# Patient Record
Sex: Male | Born: 1945
Health system: Southern US, Community
[De-identification: ages and names within clinical notes are randomized; demographics above are authoritative.]

## PROBLEM LIST (undated history)

## (undated) DIAGNOSIS — N400 Enlarged prostate without lower urinary tract symptoms: Secondary | ICD-10-CM

## (undated) DIAGNOSIS — E785 Hyperlipidemia, unspecified: Secondary | ICD-10-CM

## (undated) DIAGNOSIS — K635 Polyp of colon: Secondary | ICD-10-CM

## (undated) DIAGNOSIS — K810 Acute cholecystitis: Secondary | ICD-10-CM

## (undated) DIAGNOSIS — I10 Essential (primary) hypertension: Secondary | ICD-10-CM

## (undated) DIAGNOSIS — I480 Paroxysmal atrial fibrillation: Secondary | ICD-10-CM

## (undated) DIAGNOSIS — M109 Gout, unspecified: Secondary | ICD-10-CM

## (undated) DIAGNOSIS — I509 Heart failure, unspecified: Secondary | ICD-10-CM

## (undated) DIAGNOSIS — I4892 Unspecified atrial flutter: Secondary | ICD-10-CM

## (undated) HISTORY — DX: Acute cholecystitis: K81.0

## (undated) HISTORY — DX: Unspecified atrial flutter: I48.92

## (undated) HISTORY — DX: Gout, unspecified: M10.9

## (undated) HISTORY — DX: Benign prostatic hyperplasia without lower urinary tract symptoms: N40.0

## (undated) HISTORY — DX: Paroxysmal atrial fibrillation: I48.0

## (undated) HISTORY — PX: FINGER TENDON REPAIR: SHX1640

## (undated) HISTORY — PX: TOOTH EXTRACTION: SUR596

---

## 1999-01-15 ENCOUNTER — Encounter: Payer: Self-pay | Admitting: *Deleted

## 1999-01-15 ENCOUNTER — Ambulatory Visit (HOSPITAL_COMMUNITY): Admission: RE | Admit: 1999-01-15 | Discharge: 1999-01-15 | Payer: Self-pay | Admitting: *Deleted

## 1999-02-02 ENCOUNTER — Encounter: Payer: Self-pay | Admitting: *Deleted

## 1999-02-02 ENCOUNTER — Ambulatory Visit (HOSPITAL_COMMUNITY): Admission: RE | Admit: 1999-02-02 | Discharge: 1999-02-02 | Payer: Self-pay | Admitting: *Deleted

## 2000-02-04 ENCOUNTER — Ambulatory Visit (HOSPITAL_COMMUNITY): Admission: RE | Admit: 2000-02-04 | Discharge: 2000-02-04 | Payer: Self-pay | Admitting: *Deleted

## 2000-02-04 ENCOUNTER — Encounter: Payer: Self-pay | Admitting: *Deleted

## 2008-10-31 DIAGNOSIS — I4892 Unspecified atrial flutter: Secondary | ICD-10-CM

## 2008-10-31 HISTORY — DX: Unspecified atrial flutter: I48.92

## 2010-01-22 ENCOUNTER — Emergency Department (HOSPITAL_COMMUNITY): Admission: EM | Admit: 2010-01-22 | Discharge: 2010-01-22 | Payer: Self-pay | Admitting: Emergency Medicine

## 2010-01-23 ENCOUNTER — Ambulatory Visit (HOSPITAL_COMMUNITY): Admission: RE | Admit: 2010-01-23 | Discharge: 2010-01-23 | Payer: Self-pay | Admitting: Orthopedic Surgery

## 2011-01-24 LAB — BASIC METABOLIC PANEL
BUN: 13 mg/dL (ref 6–23)
CO2: 27 mEq/L (ref 19–32)
Calcium: 8.9 mg/dL (ref 8.4–10.5)
Chloride: 105 mEq/L (ref 96–112)
Creatinine, Ser: 1.2 mg/dL (ref 0.4–1.5)
GFR calc Af Amer: >60 mL/min (ref >60)
GFR calc non Af Amer: >60 mL/min (ref >60)
Glucose, Bld: 124 mg/dL — ABNORMAL HIGH (ref 70–99)
Potassium: 3.5 mEq/L (ref 3.5–5.1)
Sodium: 138 mEq/L (ref 135–145)

## 2011-01-24 LAB — CBC
HCT: 44.3 % (ref 39.0–52.0)
Hemoglobin: 14.8 g/dL (ref 13.0–17.0)
MCHC: 33.4 g/dL (ref 30.0–36.0)
MCV: 89 fL (ref 78.0–100.0)
Platelets: 204 10*3/uL (ref 150–400)
RBC: 4.98 MIL/uL (ref 4.22–5.81)
RDW: 14.2 % (ref 11.5–15.5)
WBC: 11.1 10*3/uL — ABNORMAL HIGH (ref 4.0–10.5)

## 2012-03-31 DIAGNOSIS — I480 Paroxysmal atrial fibrillation: Secondary | ICD-10-CM

## 2012-03-31 HISTORY — DX: Paroxysmal atrial fibrillation: I48.0

## 2012-06-03 ENCOUNTER — Inpatient Hospital Stay (HOSPITAL_COMMUNITY): Payer: No Typology Code available for payment source

## 2012-06-03 ENCOUNTER — Inpatient Hospital Stay (HOSPITAL_COMMUNITY)
Admission: EM | Admit: 2012-06-03 | Discharge: 2012-06-10 | DRG: 853 | Disposition: A | Discharge status: Home Health | Payer: No Typology Code available for payment source | Attending: Internal Medicine | Admitting: Internal Medicine

## 2012-06-03 ENCOUNTER — Emergency Department (HOSPITAL_COMMUNITY): Payer: No Typology Code available for payment source

## 2012-06-03 ENCOUNTER — Encounter (HOSPITAL_COMMUNITY): Payer: Self-pay | Admitting: *Deleted

## 2012-06-03 DIAGNOSIS — K8010 Calculus of gallbladder with chronic cholecystitis without obstruction: Secondary | ICD-10-CM | POA: Diagnosis present

## 2012-06-03 DIAGNOSIS — Z79899 Other long term (current) drug therapy: Secondary | ICD-10-CM

## 2012-06-03 DIAGNOSIS — E872 Acidosis, unspecified: Secondary | ICD-10-CM | POA: Diagnosis present

## 2012-06-03 DIAGNOSIS — R6510 Systemic inflammatory response syndrome (SIRS) of non-infectious origin without acute organ dysfunction: Secondary | ICD-10-CM | POA: Diagnosis present

## 2012-06-03 DIAGNOSIS — A4159 Other Gram-negative sepsis: Principal | ICD-10-CM | POA: Diagnosis present

## 2012-06-03 DIAGNOSIS — K750 Abscess of liver: Secondary | ICD-10-CM | POA: Diagnosis not present

## 2012-06-03 DIAGNOSIS — D72829 Elevated white blood cell count, unspecified: Secondary | ICD-10-CM | POA: Diagnosis present

## 2012-06-03 DIAGNOSIS — D696 Thrombocytopenia, unspecified: Secondary | ICD-10-CM | POA: Diagnosis present

## 2012-06-03 DIAGNOSIS — B370 Candidal stomatitis: Secondary | ICD-10-CM | POA: Diagnosis not present

## 2012-06-03 DIAGNOSIS — R7881 Bacteremia: Secondary | ICD-10-CM | POA: Diagnosis present

## 2012-06-03 DIAGNOSIS — R7401 Elevation of levels of liver transaminase levels: Secondary | ICD-10-CM | POA: Clinically undetermined

## 2012-06-03 DIAGNOSIS — N390 Urinary tract infection, site not specified: Secondary | ICD-10-CM | POA: Diagnosis present

## 2012-06-03 DIAGNOSIS — N400 Enlarged prostate without lower urinary tract symptoms: Secondary | ICD-10-CM | POA: Diagnosis present

## 2012-06-03 DIAGNOSIS — M109 Gout, unspecified: Secondary | ICD-10-CM | POA: Diagnosis present

## 2012-06-03 DIAGNOSIS — A498 Other bacterial infections of unspecified site: Secondary | ICD-10-CM | POA: Diagnosis present

## 2012-06-03 DIAGNOSIS — I10 Essential (primary) hypertension: Secondary | ICD-10-CM | POA: Diagnosis present

## 2012-06-03 DIAGNOSIS — Z7901 Long term (current) use of anticoagulants: Secondary | ICD-10-CM

## 2012-06-03 DIAGNOSIS — K8000 Calculus of gallbladder with acute cholecystitis without obstruction: Secondary | ICD-10-CM | POA: Diagnosis present

## 2012-06-03 DIAGNOSIS — K810 Acute cholecystitis: Secondary | ICD-10-CM | POA: Diagnosis present

## 2012-06-03 DIAGNOSIS — M5126 Other intervertebral disc displacement, lumbar region: Secondary | ICD-10-CM | POA: Diagnosis present

## 2012-06-03 DIAGNOSIS — Z87891 Personal history of nicotine dependence: Secondary | ICD-10-CM

## 2012-06-03 DIAGNOSIS — A419 Sepsis, unspecified organism: Secondary | ICD-10-CM

## 2012-06-03 DIAGNOSIS — E860 Dehydration: Secondary | ICD-10-CM | POA: Diagnosis present

## 2012-06-03 DIAGNOSIS — I959 Hypotension, unspecified: Secondary | ICD-10-CM | POA: Diagnosis present

## 2012-06-03 DIAGNOSIS — D6959 Other secondary thrombocytopenia: Secondary | ICD-10-CM | POA: Diagnosis not present

## 2012-06-03 DIAGNOSIS — I4891 Unspecified atrial fibrillation: Secondary | ICD-10-CM | POA: Diagnosis present

## 2012-06-03 DIAGNOSIS — R509 Fever, unspecified: Secondary | ICD-10-CM | POA: Diagnosis present

## 2012-06-03 HISTORY — DX: Essential (primary) hypertension: I10

## 2012-06-03 LAB — CBC WITH DIFFERENTIAL/PLATELET
Basophils Absolute: 0 10*3/uL (ref 0.0–0.1)
Eosinophils Relative: 0 % (ref 0–5)
HCT: 49.2 % (ref 39.0–52.0)
Hemoglobin: 16.8 g/dL (ref 13.0–17.0)
Lymphocytes Relative: 3 % — ABNORMAL LOW (ref 12–46)
Lymphs Abs: 0.4 10*3/uL — ABNORMAL LOW (ref 0.7–4.0)
MCV: 85.3 fL (ref 78.0–100.0)
Monocytes Absolute: 0.3 10*3/uL (ref 0.1–1.0)
Monocytes Relative: 2 % — ABNORMAL LOW (ref 3–12)
RDW: 16.4 % — ABNORMAL HIGH (ref 11.5–15.5)
WBC: 16.1 10*3/uL — ABNORMAL HIGH (ref 4.0–10.5)

## 2012-06-03 LAB — URINALYSIS, ROUTINE W REFLEX MICROSCOPIC
Glucose, UA: NEGATIVE mg/dL
Protein, ur: >300 mg/dL — AB

## 2012-06-03 LAB — CARDIAC PANEL(CRET KIN+CKTOT+MB+TROPI): Relative Index: 1.6 (ref 0.0–2.5)

## 2012-06-03 LAB — PROCALCITONIN: Procalcitonin: 28.61 ng/mL

## 2012-06-03 LAB — BASIC METABOLIC PANEL
BUN: 16 mg/dL (ref 6–23)
CO2: 23 mEq/L (ref 19–32)
Calcium: 8.9 mg/dL (ref 8.4–10.5)
Creatinine, Ser: 1.3 mg/dL (ref 0.50–1.35)
Glucose, Bld: 151 mg/dL — ABNORMAL HIGH (ref 70–99)

## 2012-06-03 LAB — URINE MICROSCOPIC-ADD ON

## 2012-06-03 LAB — LACTIC ACID, PLASMA: Lactic Acid, Venous: 3.5 mmol/L — ABNORMAL HIGH (ref 0.5–2.2)

## 2012-06-03 MED ORDER — WARFARIN - PHARMACIST DOSING INPATIENT: Freq: Every day | Status: DC

## 2012-06-03 MED ORDER — ONDANSETRON HCL 4 MG/2ML IJ SOLN: 4.0000 mg | Freq: Four times a day (QID) | INTRAMUSCULAR | Status: DC | PRN

## 2012-06-03 MED ORDER — SODIUM CHLORIDE 0.9 % IJ SOLN: 3.0000 mL | Freq: Two times a day (BID) | INTRAMUSCULAR | Status: DC

## 2012-06-03 MED ORDER — GADOBENATE DIMEGLUMINE 529 MG/ML IV SOLN
20.0000 mL | Freq: Once | INTRAVENOUS | Status: AC | PRN
  Administered 2012-06-03: 20 mL via INTRAVENOUS

## 2012-06-03 MED ORDER — OXYCODONE HCL 5 MG PO TABS
5.0000 mg | ORAL_TABLET | ORAL | Status: DC | PRN
  Administered 2012-06-07 (×2): 5 mg via ORAL
  Filled 2012-06-03 (×2): qty 1

## 2012-06-03 MED ORDER — IBUPROFEN 400 MG PO TABS
400.0000 mg | ORAL_TABLET | Freq: Once | ORAL | Status: AC
  Administered 2012-06-03: 400 mg via ORAL
  Filled 2012-06-03: qty 1

## 2012-06-03 MED ORDER — SODIUM CHLORIDE 0.9 % IJ SOLN: 3.0000 mL | INTRAMUSCULAR | Status: DC | PRN

## 2012-06-03 MED ORDER — ACETAMINOPHEN 650 MG RE SUPP: 650.0000 mg | Freq: Four times a day (QID) | RECTAL | Status: DC | PRN

## 2012-06-03 MED ORDER — SODIUM CHLORIDE 0.9 % IV SOLN: 250.0000 mL | INTRAVENOUS | Status: DC | PRN

## 2012-06-03 MED ORDER — ACETAMINOPHEN 325 MG PO TABS
650.0000 mg | ORAL_TABLET | Freq: Four times a day (QID) | ORAL | Status: DC | PRN
  Administered 2012-06-03 – 2012-06-04 (×2): 650 mg via ORAL
  Filled 2012-06-03 (×2): qty 2

## 2012-06-03 MED ORDER — MORPHINE SULFATE 2 MG/ML IJ SOLN
2.0000 mg | INTRAMUSCULAR | Status: DC | PRN
  Administered 2012-06-03 – 2012-06-05 (×2): 2 mg via INTRAVENOUS
  Filled 2012-06-03 (×2): qty 1

## 2012-06-03 MED ORDER — ACETAMINOPHEN 500 MG PO TABS
1000.0000 mg | ORAL_TABLET | Freq: Once | ORAL | Status: AC
  Administered 2012-06-03: 1000 mg via ORAL
  Filled 2012-06-03: qty 2

## 2012-06-03 MED ORDER — VANCOMYCIN HCL IN DEXTROSE 1-5 GM/200ML-% IV SOLN
1000.0000 mg | Freq: Two times a day (BID) | INTRAVENOUS | Status: DC
  Administered 2012-06-03 – 2012-06-04 (×2): 1000 mg via INTRAVENOUS
  Filled 2012-06-03 (×5): qty 200

## 2012-06-03 MED ORDER — SODIUM CHLORIDE 0.9 % IV SOLN
INTRAVENOUS | Status: DC
  Administered 2012-06-03 – 2012-06-06 (×2): via INTRAVENOUS

## 2012-06-03 MED ORDER — SODIUM CHLORIDE 0.9 % IV SOLN
Freq: Once | INTRAVENOUS | Status: AC
  Administered 2012-06-03: 15:00:00 via INTRAVENOUS

## 2012-06-03 MED ORDER — WARFARIN SODIUM 2.5 MG PO TABS
2.5000 mg | ORAL_TABLET | Freq: Once | ORAL | Status: AC
  Administered 2012-06-03: 2.5 mg via ORAL
  Filled 2012-06-03: qty 1

## 2012-06-03 MED ORDER — SODIUM CHLORIDE 0.9 % IV BOLUS (SEPSIS)
500.0000 mL | Freq: Once | INTRAVENOUS | Status: AC
  Administered 2012-06-03: 13:00:00 via INTRAVENOUS

## 2012-06-03 MED ORDER — CIPROFLOXACIN IN D5W 400 MG/200ML IV SOLN
400.0000 mg | Freq: Once | INTRAVENOUS | Status: AC
  Administered 2012-06-03: 400 mg via INTRAVENOUS
  Filled 2012-06-03: qty 200

## 2012-06-03 MED ORDER — ALLOPURINOL 300 MG PO TABS
300.0000 mg | ORAL_TABLET | Freq: Every day | ORAL | Status: DC
  Administered 2012-06-04 – 2012-06-10 (×6): 300 mg via ORAL
  Filled 2012-06-03 (×7): qty 1

## 2012-06-03 MED ORDER — ONDANSETRON HCL 4 MG PO TABS: 4.0000 mg | ORAL_TABLET | Freq: Four times a day (QID) | ORAL | Status: DC | PRN

## 2012-06-03 MED ORDER — ADULT MULTIVITAMIN W/MINERALS CH
1.0000 | ORAL_TABLET | Freq: Every day | ORAL | Status: DC
  Administered 2012-06-03 – 2012-06-10 (×7): 1 via ORAL
  Filled 2012-06-03 (×8): qty 1

## 2012-06-03 MED ORDER — AMIODARONE HCL 200 MG PO TABS
200.0000 mg | ORAL_TABLET | Freq: Two times a day (BID) | ORAL | Status: DC
  Administered 2012-06-03 – 2012-06-05 (×5): 200 mg via ORAL
  Filled 2012-06-03 (×8): qty 1

## 2012-06-03 MED ORDER — SODIUM CHLORIDE 0.9 % IV SOLN
500.0000 mg | Freq: Three times a day (TID) | INTRAVENOUS | Status: DC
  Administered 2012-06-03 – 2012-06-04 (×2): 500 mg via INTRAVENOUS
  Filled 2012-06-03 (×5): qty 500

## 2012-06-03 MED ORDER — SODIUM CHLORIDE 0.9 % IV SOLN
INTRAVENOUS | Status: DC
  Administered 2012-06-03 (×2): via INTRAVENOUS

## 2012-06-03 NOTE — Progress Notes (Signed)
3:49 PM Pt is 41 man with chills and fever last night, today has elevated WBC, lactic acid 3.5, and UA suggesting UTI.  Case discussed with Dr. Delford Field of PCCM --> advised Rx with Cipro, and admission to stepdown unit by the [facility].

## 2012-06-03 NOTE — ED Notes (Addendum)
Woke up sob which went on for an 1 hr. Uncontrolled afib 110-140. No c/o sob now. Pt. States, "felt good yesterday, woke up with chills today."

## 2012-06-03 NOTE — ED Provider Notes (Signed)
Medical screening examination/treatment/procedure(s) were conducted as a shared visit with non-physician practitioner(s) and myself.  I personally evaluated the patient during the encounter 3:49 PM  Pt is 66 man with chills and fever last night, today has elevated WBC, lactic acid 3.5, and UA suggesting UTI. Case discussed with Dr. Delford Field of PCCM --> advised Rx with Cipro, and admission to stepdown unit by the [facility].   Carleene Cooper III, MD 06/03/12 [phone]

## 2012-06-03 NOTE — ED Provider Notes (Signed)
History     CSN: [id number]  Arrival date & time 06/03/12  1058   First MD Initiated Contact with Patient 06/03/12 1110      Chief Complaint  Patient presents with  . Shortness of Breath  . Atrial Fibrillation    (Consider location/radiation/quality/duration/timing/severity/associated sxs/prior treatment) HPI Comments: BASIM BARTNIK 66 y.o. male   The chief complaint is: Patient presents with:   Shortness of Breath   Atrial Fibrillation   The patient has medical history significant for:   Past Medical History:   Hypertension                                                 Atrial fibrillation                                         The onset of the symptoms was  abrupt starting 6 hours ago,  The Course is  persistent, gradually worsened exertion makes symptoms worse, nothing makes symptoms better Has associated fatigue, weakness, dizziness Denies  trouble with ambulation, trouble with speech, headache, change in vision, cough, congestion, chest pain, abdominal pain, nausea, vomiting, diarrhea, constipation.      The history is provided by the patient, the EMS personnel, medical records and the spouse. History Limited By: none.   KEEVAN WOLZ is a 66 y.o. male presents to the emergency room c/o fatigue, diaphoresis, shortness of breath since 4am.  5 weeks ago diagnosed with a-fib and put on PO medications.  He has taken his medication as prescribed without complication.  He became dizzy and SOB this morning, went back to bed and awoke continuing to feel poorly.  He called the cardiologist, Dr Verdis Prime, whose triage nurse recommended a visit to the ER.  EMS who found him tachypnic but otherwise stable. He also complains of extreme fatigue and weakness. He denies trouble with ambulation. He denies headache, change in vision, cough, congestion, chest pain, abdominal pain, nausea, vomiting, diarrhea, constipation.  Nothing makes the symptoms better, exertion makes the  symptoms worse. He has not had his morning dose of usual home medications.   Past Medical History  Diagnosis Date  . Hypertension   . Atrial fibrillation     History reviewed. No pertinent past surgical history.  No family history on file.  History  Substance Use Topics  . Smoking status: Not on file  . Smokeless tobacco: Not on file  . Alcohol Use: Not on file      Review of Systems  Constitutional: Positive for diaphoresis and fatigue. Negative for fever, appetite change and unexpected weight change.  HENT: Negative for mouth sores, neck pain and neck stiffness.   Eyes: Negative for visual disturbance.  Respiratory: Positive for shortness of breath. Negative for cough, chest tightness and wheezing.   Cardiovascular: Negative for chest pain and leg swelling.  Gastrointestinal: Negative for nausea, vomiting, abdominal pain, diarrhea and constipation.  Genitourinary: Negative for dysuria, urgency, frequency and hematuria.  Musculoskeletal: Negative for back pain.  Skin: Negative for rash.  Neurological: Positive for dizziness, weakness and light-headedness. Negative for syncope, speech difficulty and headaches.  Hematological: Does not bruise/bleed easily.  Psychiatric/Behavioral: Negative for disturbed wake/sleep cycle. The patient is not nervous/anxious.     Allergies  Penicillins  Home Medications   Current Outpatient Rx  Name Route Sig Dispense Refill  . ALLOPURINOL 300 MG PO TABS Oral Take 300 mg by mouth daily.    . AMIODARONE HCL 200 MG PO TABS Oral Take 200 mg by mouth 2 (two) times daily.    Marland Kitchen DILTIAZEM HCL ER COATED BEADS 240 MG PO CP24 Oral Take 240 mg by mouth daily.    Marland Kitchen HYDROCODONE-ACETAMINOPHEN 5-500 MG PO TABS Oral Take 1-2 tablets by mouth every 6 (six) hours as needed. For pain    . ADULT MULTIVITAMIN W/MINERALS CH Oral Take 1 tablet by mouth daily.    . WARFARIN SODIUM 5 MG PO TABS Oral Take 5 mg by mouth every evening. Take 1 tablet on Sun, Tues,  Thurs and take 0.5 tablet on wed, fri, sat.  Appt on Mon 06/04/12      BP 91/52  Pulse 83  Temp 102 F (38.9 C) (Rectal)  Resp 15  SpO2 97%  Physical Exam  Nursing note and vitals reviewed. Constitutional: He is oriented to person, place, and time. He appears well-developed and well-nourished. No distress.  HENT:  Head: Normocephalic and atraumatic.  Mouth/Throat: Oropharynx is clear and moist. No oropharyngeal exudate.  Eyes: Conjunctivae and EOM are normal. Pupils are equal, round, and reactive to light. No scleral icterus.  Neck: Normal range of motion. Neck supple.  Cardiovascular: Normal heart sounds and intact distal pulses.  An irregular rhythm present. Tachycardia present.   No murmur heard. Pulses:      Radial pulses are 2+ on the right side, and 2+ on the left side.       Dorsalis pedis pulses are 2+ on the right side, and 2+ on the left side.       Posterior tibial pulses are 2+ on the right side, and 2+ on the left side.  Pulmonary/Chest: Breath sounds normal. No accessory muscle usage. Tachypnea noted. No respiratory distress. He has no decreased breath sounds. He has no wheezes. He has no rales. He exhibits no tenderness.  Abdominal: Soft. Bowel sounds are normal. He exhibits no mass. There is no tenderness. There is no rebound and no guarding.  Musculoskeletal: Normal range of motion. He exhibits no edema.  Lymphadenopathy:    He has no cervical adenopathy.  Neurological: He is alert and oriented to person, place, and time. He exhibits normal muscle tone. Coordination normal.       Speech is clear and goal oriented, follows commands no facial droop Normal strength in upper and lower extremities bilaterally, strong and equal grip strength Sensation normal to light touch Moves extremities without ataxia, coordination intact Normal finger to nose and rapid alternating movements No pronator drift  Skin: Skin is warm. He is diaphoretic. There is pallor.  Psychiatric: He  has a normal mood and affect.    ED Course  Procedures (including critical care time)  Labs Reviewed  URINALYSIS, ROUTINE W REFLEX MICROSCOPIC - Abnormal; Notable for the following:    Color, Urine ORANGE (*)  BIOCHEMICALS MAY BE AFFECTED BY COLOR   APPearance CLOUDY (*)     Hgb urine dipstick LARGE (*)     Bilirubin Urine SMALL (*)     Ketones, ur 15 (*)     Protein, ur >300 (*)     Nitrite POSITIVE (*)     Leukocytes, UA SMALL (*)     All other components within normal limits  CBC WITH DIFFERENTIAL - Abnormal; Notable for the following:  WBC 16.1 (*)     RDW 16.4 (*)     Neutrophils Relative 95 (*)     Neutro Abs 15.3 (*)     Lymphocytes Relative 3 (*)     Lymphs Abs 0.4 (*)     Monocytes Relative 2 (*)     All other components within normal limits  BASIC METABOLIC PANEL - Abnormal; Notable for the following:    Glucose, Bld 151 (*)     GFR calc non Af Amer 55 (*)     GFR calc Af Amer 64 (*)     All other components within normal limits  LACTIC ACID, PLASMA - Abnormal; Notable for the following:    Lactic Acid, Venous 3.5 (*)     All other components within normal limits  URINE MICROSCOPIC-ADD ON - Abnormal; Notable for the following:    Bacteria, UA FEW (*)     Casts HYALINE CASTS (*)     All other components within normal limits  CARDIAC PANEL(CRET KIN+CKTOT+MB+TROPI)  URINE CULTURE  CULTURE, BLOOD (ROUTINE X 2)    Results for orders placed during the hospital encounter of 06/03/12  URINALYSIS, ROUTINE W REFLEX MICROSCOPIC      Component Value Range   Color, Urine ORANGE (*) YELLOW   APPearance CLOUDY (*) CLEAR   Specific Gravity, Urine 1.020  1.005 - 1.030   pH 5.5  5.0 - 8.0   Glucose, UA NEGATIVE  NEGATIVE mg/dL   Hgb urine dipstick LARGE (*) NEGATIVE   Bilirubin Urine SMALL (*) NEGATIVE   Ketones, ur 15 (*) NEGATIVE mg/dL   Protein, ur >161 (*) NEGATIVE mg/dL   Urobilinogen, UA 1.0  0.0 - 1.0 mg/dL   Nitrite POSITIVE (*) NEGATIVE   Leukocytes, UA  SMALL (*) NEGATIVE  CBC WITH DIFFERENTIAL      Component Value Range   WBC 16.1 (*) 4.0 - 10.5 K/uL   RBC 5.77  4.22 - 5.81 MIL/uL   Hemoglobin 16.8  13.0 - 17.0 g/dL   HCT 09.6  04.5 - 40.9 %   MCV 85.3  78.0 - 100.0 fL   MCH 29.1  26.0 - 34.0 pg   MCHC 34.1  30.0 - 36.0 g/dL   RDW 81.1 (*) 91.4 - 78.2 %   Platelets 157  150 - 400 K/uL   Neutrophils Relative 95 (*) 43 - 77 %   Neutro Abs 15.3 (*) 1.7 - 7.7 K/uL   Lymphocytes Relative 3 (*) 12 - 46 %   Lymphs Abs 0.4 (*) 0.7 - 4.0 K/uL   Monocytes Relative 2 (*) 3 - 12 %   Monocytes Absolute 0.3  0.1 - 1.0 K/uL   Eosinophils Relative 0  0 - 5 %   Eosinophils Absolute 0.0  0.0 - 0.7 K/uL   Basophils Relative 0  0 - 1 %   Basophils Absolute 0.0  0.0 - 0.1 K/uL  BASIC METABOLIC PANEL      Component Value Range   Sodium 141  135 - 145 mEq/L   Potassium 3.8  3.5 - 5.1 mEq/L   Chloride 103  96 - 112 mEq/L   CO2 23  19 - 32 mEq/L   Glucose, Bld 151 (*) 70 - 99 mg/dL   BUN 16  6 - 23 mg/dL   Creatinine, Ser 9.56  0.50 - 1.35 mg/dL   Calcium 8.9  8.4 - 21.3 mg/dL   GFR calc non Af Amer 55 (*) >90 mL/min   GFR calc Af  Amer 64 (*) >90 mL/min  CARDIAC PANEL(CRET KIN+CKTOT+MB+TROPI)      Component Value Range   Total CK 123  7 - 232 U/L   CK, MB 2.0  0.3 - 4.0 ng/mL   Troponin I <0.30  <0.30 ng/mL   Relative Index 1.6  0.0 - 2.5  LACTIC ACID, PLASMA      Component Value Range   Lactic Acid, Venous 3.5 (*) 0.5 - 2.2 mmol/L  URINE MICROSCOPIC-ADD ON      Component Value Range   WBC, UA 0-2  <3 WBC/hpf   RBC / HPF 7-10  <3 RBC/hpf   Bacteria, UA FEW (*) RARE   Casts HYALINE CASTS (*) NEGATIVE   Urine-Other MUCOUS PRESENT     Dg Chest 2 View  06/03/2012  *RADIOLOGY REPORT*  Clinical Data: Weakness and chills  CHEST - 2 VIEW  Comparison: None.  Findings: The heart and pulmonary vascularity are within normal limits.  The lungs are clear bilaterally.  No acute bony abnormality is seen.  IMPRESSION: No acute abnormality is noted.   Original Report Authenticated By: Phillips Odor, M.D.     1. Fever and chills   2. SIRS (systemic inflammatory response syndrome)   3. UTI (urinary tract infection)   4. Afib     CRITICAL CARE Performed by: Dierdre Forth,   Total critical care time: 1 hours  Critical care time was exclusive of separately billable procedures and treating other patients.  Critical care was necessary to treat or prevent imminent or life-threatening deterioration.  Critical care was time spent personally by me on the following activities: development of treatment plan with patient and/or surrogate as well as nursing, discussions with consultants, evaluation of patient's response to treatment, examination of patient, obtaining history from patient or surrogate, ordering and performing treatments and interventions, ordering and review of laboratory studies, ordering and review of radiographic studies, pulse oximetry and re-evaluation of patient's condition.    MDM  [name] presents complaining of fatigue, diaphoresis and weakness. His EKG shows that he is currently in A. fib with a rate of approximately 115.  He is also febrile at 100.3. It is likely that recurrence of A. fib is due to an infection of some sort.  My biggest concern at this point is that he may be septic.  He does meet the criteria for SIRS, therefore we will evaluate for a source of infection.  I will also evaluate for cardiac etiologies.  He remains hypotensive, therefore we will continue fluid boluses.  He is also remains febrile to 102, therefore I will re-dose his antipyretic.  His cardiac panel is negative at this time.  He has a leukocytosis of 16.1 with a left shift and a lactic acid of 3.5.  His CXR is negative.  At this point I do not have a source of infection, but am very suspicious of sepsis.  Urine and blood cultures are pending.  I have discussed the patient with Dr Ignacia Palma and we will plan to admit for IV  antibiotics.  Urine concerning for a possible UTI, I will start Cipro 400mg  IV.  Consult with critical care was made and Dr Danise Mina is recommending a step-down bed.  Consult with the hospitalist service for admission and he will be admitted by Dr Brien Few on Team 4 in the stepdown unit.   Dahlia Client Mylei Brackeen, PA-C 06/03/12 [phone]

## 2012-06-03 NOTE — ED Notes (Signed)
Family at the bedside. Pt currently eating dinner.

## 2012-06-03 NOTE — H&P (Addendum)
PCP:   No primary provider on file.   Chief Complaint:  Fever and chills, since 06/02/12, fast heart rate.  HPI: This is a 66 year old male, with known history of gout, HTN, atrial fibrillation on chronic anticoagulation, history of surgery to left little finger for traumatic injury/distal phalangeal fracture 12/2009, presenting with above symptoms. According to patient, he was quit e well on 06/01/12, and went to bed after supper as usual. At about 2:00 AM on 06/02/12, he was woken up by low back pain, and took some vicodin, with relief. He had no urinary frequency or dysuria. He seemed okay on that day, but at 4:00 AM on 06/03/12, he woke up with shaking chills, and a fever, and felt sweaty. He was subsequently able to go back to sleep, but later, on getting up, he felt very weak and tired, and at about 9:00 AM, had further chills. He was unable to eat, and sat down in a chair, till his spouse got concerned, and called 911. On arrival in the ED, systolic BP was in the 90s, and patient was tachycardic, with HR of 117.   Allergies:   Allergies  Allergen Reactions  . Penicillins     unknown      Past Medical History  Diagnosis Date  . Hypertension   . Atrial fibrillation     History reviewed. No pertinent past surgical history.  Prior to Admission medications   Medication Sig Start Date End Date Taking? Authorizing Provider  allopurinol (ZYLOPRIM) 300 MG tablet Take 300 mg by mouth daily.   Yes Historical Provider, MD  amiodarone (PACERONE) 200 MG tablet Take 200 mg by mouth 2 (two) times daily.   Yes Historical Provider, MD  diltiazem (CARDIZEM CD) 240 MG 24 hr capsule Take 240 mg by mouth daily.   Yes Historical Provider, MD  HYDROcodone-acetaminophen (VICODIN) 5-500 MG per tablet Take 1-2 tablets by mouth every 6 (six) hours as needed. For pain   Yes Historical Provider, MD  Multiple Vitamin (MULTIVITAMIN WITH MINERALS) TABS Take 1 tablet by mouth daily.   Yes Historical Provider, MD    warfarin (COUMADIN) 5 MG tablet Take 5 mg by mouth every evening. Take 1 tablet on Sun, Tues, Thurs and take 0.5 tablet on wed, fri, sat.  Appt on Mon 06/04/12   Yes Historical Provider, MD    Social History: Patient is retired. He used to work in [employer] stage [employer].  He is married with 2 offspring and does not have a smoking history on file. He does not have any smokeless tobacco history on file. Nonsmoker, nondrinker, no history of drug abuse.  Family History: father died at age 45 years, s/p MI. Mother died at age 45 years, s/p ruptured intracranial aneurysm.  Review of Systems:  As per HPI and chief complaint. Patent denies diminished appetite, weight loss, headache, neck stiffness, photophobia, blurred vision, difficulty in speaking, dysphagia, chest pain, cough, shortness of breath, orthopnea, paroxysmal nocturnal dyspnea, nausea, abdominal pain, vomiting, diarrhea, belching, heartburn, hematemesis, melena, dysuria, nocturia, urinary frequency, hematochezia, lower extremity swelling, pain, or redness. He has a tendency to be constipated. The rest of the systems review is negative.  Physical Exam:  General:  Patient does not appear to be in obvious acute distress at the time of this evaluation, mildly hard of hearing, alert, communicative, fully oriented, talking in complete sentences, not short of breath at rest.  HEENT:  No clinical pallor, no jaundice, no conjunctival injection or discharge. Hydration status appears  fair.  NECK:  Supple, JVP not seen, no carotid bruits, no palpable lymphadenopathy, no palpable goiter. CHEST:  Clinically clear to auscultation, no wheezes, no crackles. HEART:  Sounds 1 and 2 heard, normal, irregular, no murmurs. ABDOMEN:  Full, soft, no scars, non-tender, no palpable organomegaly, no palpable masses, normal bowel sounds. GENITALIA:  Not examined. LOWER EXTREMITIES:  No pitting edema, palpable peripheral pulses, has prominent  varicosities. MUSCULOSKELETAL SYSTEM:  Generalized osteoarthritic changes, otherwise, normal. CENTRAL NERVOUS SYSTEM:  No focal neurologic deficit on gross examination.  Labs on Admission:  Results for orders placed during the hospital encounter of 06/03/12 (from the past 48 hour(s))  CBC WITH DIFFERENTIAL     Status: Abnormal   Collection Time   06/03/12 11:35 AM      Component Value Range Comment   WBC 16.1 (*) 4.0 - 10.5 K/uL    RBC 5.77  4.22 - 5.81 MIL/uL    Hemoglobin 16.8  13.0 - 17.0 g/dL    HCT 08.6  57.8 - 46.9 %    MCV 85.3  78.0 - 100.0 fL    MCH 29.1  26.0 - 34.0 pg    MCHC 34.1  30.0 - 36.0 g/dL    RDW 62.9 (*) 52.8 - 15.5 %    Platelets 157  150 - 400 K/uL    Neutrophils Relative 95 (*) 43 - 77 %    Neutro Abs 15.3 (*) 1.7 - 7.7 K/uL    Lymphocytes Relative 3 (*) 12 - 46 %    Lymphs Abs 0.4 (*) 0.7 - 4.0 K/uL    Monocytes Relative 2 (*) 3 - 12 %    Monocytes Absolute 0.3  0.1 - 1.0 K/uL    Eosinophils Relative 0  0 - 5 %    Eosinophils Absolute 0.0  0.0 - 0.7 K/uL    Basophils Relative 0  0 - 1 %    Basophils Absolute 0.0  0.0 - 0.1 K/uL   BASIC METABOLIC PANEL     Status: Abnormal   Collection Time   06/03/12 11:35 AM      Component Value Range Comment   Sodium 141  135 - 145 mEq/L    Potassium 3.8  3.5 - 5.1 mEq/L    Chloride 103  96 - 112 mEq/L    CO2 23  19 - 32 mEq/L    Glucose, Bld 151 (*) 70 - 99 mg/dL    BUN 16  6 - 23 mg/dL    Creatinine, Ser 4.13  0.50 - 1.35 mg/dL    Calcium 8.9  8.4 - 24.4 mg/dL    GFR calc non Af Amer 55 (*) >90 mL/min    GFR calc Af Amer 64 (*) >90 mL/min   CARDIAC PANEL(CRET KIN+CKTOT+MB+TROPI)     Status: Normal   Collection Time   06/03/12 11:35 AM      Component Value Range Comment   Total CK 123  7 - 232 U/L    CK, MB 2.0  0.3 - 4.0 ng/mL    Troponin I <0.30  <0.30 ng/mL    Relative Index 1.6  0.0 - 2.5   LACTIC ACID, PLASMA     Status: Abnormal   Collection Time   06/03/12 12:37 PM      Component Value Range Comment    Lactic Acid, Venous 3.5 (*) 0.5 - 2.2 mmol/L   URINALYSIS, ROUTINE W REFLEX MICROSCOPIC     Status: Abnormal   Collection Time   06/03/12  2:52 PM      Component Value Range Comment   Color, Urine ORANGE (*) YELLOW BIOCHEMICALS MAY BE AFFECTED BY COLOR   APPearance CLOUDY (*) CLEAR    Specific Gravity, Urine 1.020  1.005 - 1.030    pH 5.5  5.0 - 8.0    Glucose, UA NEGATIVE  NEGATIVE mg/dL    Hgb urine dipstick LARGE (*) NEGATIVE    Bilirubin Urine SMALL (*) NEGATIVE    Ketones, ur 15 (*) NEGATIVE mg/dL    Protein, ur >578 (*) NEGATIVE mg/dL    Urobilinogen, UA 1.0  0.0 - 1.0 mg/dL    Nitrite POSITIVE (*) NEGATIVE    Leukocytes, UA SMALL (*) NEGATIVE   URINE MICROSCOPIC-ADD ON     Status: Abnormal   Collection Time   06/03/12  2:52 PM      Component Value Range Comment   WBC, UA 0-2  <3 WBC/hpf    RBC / HPF 7-10  <3 RBC/hpf    Bacteria, UA FEW (*) RARE    Casts HYALINE CASTS (*) NEGATIVE    Urine-Other MUCOUS PRESENT       Radiological Exams on Admission: *RADIOLOGY REPORT*  Clinical Data: Weakness and chills  CHEST - 2 VIEW  Comparison: None.  Findings: The heart and pulmonary vascularity are within normal  limits. The lungs are clear bilaterally. No acute bony  abnormality is seen.  IMPRESSION:  No acute abnormality is noted.  Original Report Authenticated By: Phillips Odor, M.D.   Assessment/Plan Active Problems:  1. Sepsis: Patient presents with fatigue, fever, shaking chills, a leukocytosis of 16.1, lactic acidosis of 3.5, as well as tachycardia and hypotension, all consistent with SIRS/Sepsis. Etiology is unclear at this time, with a normal CXR, and no obvious foci of infection. Urinalysis is devoid of pyuria, and there are only few bacteria, making a UTI somewhat equivocal. Back pain is concerning, for possible diskitis/abscess, but physical examination of the spine failed to reveal localized tenderness. We shall admit patient to SDU for close monitoring as his BP is  still borderline at best, despite aggressive iv fluid resuscitation in the ED, discontinue antihypertensives, check procalcitonin and cortisol levels, carry out septic work up with blood and urine cultures, do renal ultrasound/lumbar MRI, and cover empirically with broad spectrum antibiotics, ie, intravenous Vancomycin/Primaxin. 2D echo has been ordered, to evaluate for endocarditis.  2. UTI (lower urinary tract infection): The evidence for this is tenuous at this time. Perhaps, he may have early UTI, but this would not explain his SIRS picture. Await urine culture, and manage as above.  3. Atrial fibrillation: Patient had a tachycardia of 117 at presentation, but is now rate-controlled, with HR of 84-86, after iv fluids. Likely, his presenting tachycardia was part of a compensatory response to hypotension and volume depletion. 4. Gout: this is not problematic at this time. 5. History of HTN: As described above, antihypertensives are on hold, due to hemodynamic instability.   Further management will depend on clinical course.   Patient is Full Code.   Time Spent on Admission: 1 hour.   Sumaiya Arruda,CHRISTOPHER 06/03/2012, 4:16 PM

## 2012-06-03 NOTE — ED Provider Notes (Signed)
11:13 AM  Date: 06/03/2012  Rate: 126  Rhythm: atrial fibrillation  QRS Axis: normal  Intervals: QT prolonged  ST/T Wave abnormalities: ST depressions laterally  Conduction Disutrbances:  Incomplete right bundle branch block.  Narrative Interpretation: Abnormal EKG  Old EKG Reviewed: changes noted--was in sinus rhythm in March 2011.      Carleene Cooper III, MD 06/03/12 [phone]

## 2012-06-03 NOTE — ED Notes (Signed)
Patient still in radiology.

## 2012-06-03 NOTE — ED Notes (Signed)
Pt was notified of urine sample. Pt is requesting water to drink to help with urination.

## 2012-06-03 NOTE — Progress Notes (Signed)
ANTIBIOTIC & ANTICOAGULATION CONSULT NOTE - Initial Consult  Pharmacy Consult for Vancomycin + Primaxin + Warfarin Indication: Empiric sepsis coverage and hx Afib  Allergies  Allergen Reactions  . Penicillins     unknown    Patient Measurements: Height: 5\' 11"  (180.3 cm) Weight: 220 lb (99.791 kg) IBW/kg (Calculated) : 75.3   Vital Signs: Temp: 102 F (38.9 C) (08/04 1501) Temp src: Rectal (08/04 1501) BP: 98/61 mmHg (08/04 1700) Pulse Rate: 84  (08/04 1700)  Labs:  Basename 06/03/12 1135  HGB 16.8  HCT 49.2  PLT 157  APTT --  LABPROT --  INR --  HEPARINUNFRC --  CREATININE 1.30  CKTOTAL 123  CKMB 2.0  TROPONINI <0.30    Estimated Creatinine Clearance: 65.5 ml/min (by C-G formula based on Cr of 1.3).   Medical History: Past Medical History  Diagnosis Date  . Hypertension   . Atrial fibrillation     Assessment: 66 y.o. M with hx Afib who presented to the [facility] on 8/4 with fever and chills. The patient was hypotensive on admission, with leukocytosis (WBC 16.1), and lactic acid 3.5. Pharmacy has been consulted to start Vancomycin + Primaxin for empiric sepsis coverage. Wt: 99.8 kg, SCr 1.3, CrCl~55-65 ml/min.   Pharmacy has also been consulted to resume the patient's home warfarin for hx Afib. PTA the dose was 5 mg daily EXCEPT for 2.5 mg on Wed/Fri/Sat. His last dose was on 8/3 and he was supposed to go for an INR check on 8/5. INR this evening is slight SUPRAtherapeutic (INR 3.06, goal of 2-3)  Goal of Therapy:  INR 2-3 Vancomycin trough of 15-20 mcg/ml   Plan:  1. Vancomycin 1g IV every 12 hours 2. Primaxin 500 mg IV every 8 hours 3. Warfarin 2.5 mg x 1 dose  4. Daily PT/INR 5. Will continue to monitor for any signs/symptoms of bleeding and will follow up with PT/INR in the a.m.  6. Will continue to follow renal function, culture results, LOT, and antibiotic de-escalation plans   Georgina Pillion, PharmD, BCPS Clinical Pharmacist Pager:  [phone] 06/03/2012 6:06 PM

## 2012-06-03 NOTE — Progress Notes (Signed)
Pt temp back up to 101.5 oral Tylenol not available till 0249, page M lynch who ordered motrin x 1 dose

## 2012-06-03 NOTE — ED Notes (Signed)
Patient transported to MRI then [place]

## 2012-06-04 DIAGNOSIS — M5126 Other intervertebral disc displacement, lumbar region: Secondary | ICD-10-CM | POA: Diagnosis present

## 2012-06-04 DIAGNOSIS — D696 Thrombocytopenia, unspecified: Secondary | ICD-10-CM | POA: Diagnosis present

## 2012-06-04 DIAGNOSIS — R7881 Bacteremia: Secondary | ICD-10-CM | POA: Diagnosis present

## 2012-06-04 DIAGNOSIS — R509 Fever, unspecified: Secondary | ICD-10-CM | POA: Diagnosis present

## 2012-06-04 DIAGNOSIS — N400 Enlarged prostate without lower urinary tract symptoms: Secondary | ICD-10-CM | POA: Diagnosis present

## 2012-06-04 DIAGNOSIS — E860 Dehydration: Secondary | ICD-10-CM | POA: Diagnosis present

## 2012-06-04 DIAGNOSIS — D72829 Elevated white blood cell count, unspecified: Secondary | ICD-10-CM | POA: Diagnosis present

## 2012-06-04 LAB — CBC
MCHC: 33 g/dL (ref 30.0–36.0)
Platelets: 110 10*3/uL — ABNORMAL LOW (ref 150–400)
RDW: 16.7 % — ABNORMAL HIGH (ref 11.5–15.5)

## 2012-06-04 LAB — COMPREHENSIVE METABOLIC PANEL
ALT: 69 U/L — ABNORMAL HIGH (ref 0–53)
Alkaline Phosphatase: 89 U/L (ref 39–117)
BUN: 22 mg/dL (ref 6–23)
CO2: 22 mEq/L (ref 19–32)
GFR calc Af Amer: 71 mL/min — ABNORMAL LOW (ref >90)
GFR calc non Af Amer: 62 mL/min — ABNORMAL LOW (ref >90)
Glucose, Bld: 122 mg/dL — ABNORMAL HIGH (ref 70–99)
Potassium: 3.6 mEq/L (ref 3.5–5.1)
Sodium: 138 mEq/L (ref 135–145)

## 2012-06-04 LAB — PROTIME-INR
INR: 3.32 — ABNORMAL HIGH (ref 0.00–1.49)
Prothrombin Time: 34.2 seconds — ABNORMAL HIGH (ref 11.6–15.2)

## 2012-06-04 MED ORDER — SENNA 8.6 MG PO TABS
2.0000 | ORAL_TABLET | Freq: Every day | ORAL | Status: DC
  Administered 2012-06-04 – 2012-06-09 (×5): 17.2 mg via ORAL
  Filled 2012-06-04 (×6): qty 2

## 2012-06-04 MED ORDER — MAGNESIUM CITRATE PO SOLN
1.0000 | Freq: Every day | ORAL | Status: DC | PRN
  Filled 2012-06-04: qty 296

## 2012-06-04 MED ORDER — SODIUM CHLORIDE 0.9 % IV BOLUS (SEPSIS)
500.0000 mL | Freq: Once | INTRAVENOUS | Status: AC
  Administered 2012-06-04: 500 mL via INTRAVENOUS

## 2012-06-04 MED ORDER — SODIUM CHLORIDE 0.9 % IV SOLN
500.0000 mg | Freq: Four times a day (QID) | INTRAVENOUS | Status: DC
  Administered 2012-06-04 – 2012-06-10 (×23): 500 mg via INTRAVENOUS
  Filled 2012-06-04 (×27): qty 500

## 2012-06-04 MED ORDER — POLYETHYLENE GLYCOL 3350 17 G PO PACK
17.0000 g | PACK | Freq: Every day | ORAL | Status: DC
  Administered 2012-06-04 – 2012-06-09 (×5): 17 g via ORAL
  Filled 2012-06-04 (×6): qty 1

## 2012-06-04 MED ORDER — SODIUM CHLORIDE 0.9 % IV SOLN
500.0000 mg | Freq: Four times a day (QID) | INTRAVENOUS | Status: DC
  Filled 2012-06-04 (×3): qty 500

## 2012-06-04 NOTE — Progress Notes (Addendum)
[facility] Progress Note [place] TEAM 1 - Stepdown/ICU TEAM   LYTLE MALBURG ZOX:[id number] DOB: October 09, 1946 DOA: 06/03/2012 PCP: No primary provider on file.  Brief narrative: 66 year old male patient with chronic atrial fibrillation on Coumadin as well as hypertension. Was in usual state of health when he awakened at 2 AM on the morning of 06/02/2012 with low back pain. He had some leftover Vicodin which he took and this improved his back pain. He had no urinary symptoms such as frequency or dysuria. Throughout the remainder of the day he apparently felt okay but at 4 AM 06/03/2012 he again woke up this time with shaking chills, fever and he felt sweaty. He went back to sleep but when he awakened later in the morning he felt very weak and tired. Later in the morning he had further chills and was unable to eat. He sat down in a chair and wasn't as active as usual. His wife he came concerned about his overall status so she called 911. The patient was transported to [facility] emergency department where his systolic blood pressure was found to be relatively low in the 90s and the patient was tachycardic with a heart rate of 117. Physical exam was otherwise unremarkable. The patient had a mild leukocytosis of 16,000 but his hemoglobin seemed to be elevated and hemoconcentrated at 16.8. His lactic acid was also elevated at 3.5 and his urinalysis appeared consistent with an acute urinary tract infection. Chest x-ray was negative for any source of infection or any volume overload. Because he had a pattern of hypertension that responded to IV fluids with a likely source of infection from the urinary tract it was felt the patient had systemic inflammatory response syndrome and was admitted to the step down unit for further monitoring and evaluation. Pulmonary critical care medicine was consulted and did not feel that the patient met criteria for septic shock protocol.  Assessment/Plan:  SIRS (systemic  inflammatory response syndrome)/Leukocytosis/ Fever *Continue supportive care *Continue hydration and empiric antibiotics to cover gram-negative organisms with likely source suspected to be urinary tract  UTI (lower urinary tract infection) *Urine culture pending *Continue empiric antibiotic  Thrombocytopenia *Likely secondary to gram-negative infection *Continue to follow CBC  Gram-negative bacteremia *Patient has had blood cultures obtained this admission - the first set demonstrating gram-negative organisms *Likely source from urinary tract  Atrial fibrillation with controlled ventricular response *At presentation was slightly tachycardic but with rehydration tachycardia has  resolved *Because of hypotension all rate control agents currently are on hold *Monitor for rebound tachycardia/beta blocker withdrawal and if blood pressure can support can provide when necessary IV Lopressor  Hypotension/ Dehydration *Multi-factorial related to gram-negative infection as well as volume depletion *Systolic blood pressure did dip into the 80s this morning so I have given 500 cc normal saline bolus earlier and blood pressure is increased to greater than 110 systolic with adequate urine output *Continue maintenance IV fluids at 150 cc per hour  Transaminitis *Likely secondary to shock liver  *Repeat CMET in AM *Check an acute hepatitis panel *Complete abdominal ultrasound will be accomplished if the LFTs do not quickly normalize with volume expansion *Abdominal exam is benign so low index of suspicion that has acute cholecystitis  Mild coagulopathy *Patient on chronic Coumadin and INR has increased slightly since admission and likely do to acute infection as well as antibiotic usage *Management per pharmacy  BPH (benign prostatic hyperplasia)  Lumbar disc herniation *Seen on MRI spine this admission-no infection/abscess or osteomyelitis  seen *Patient denies lower strategy weakness,  numbness, tingling or pain second of for additional workup to primary care physician  Gout   DVT prophylaxis: Chronically anticoagulated on Coumadin with therapeutic INR at presentation Code Status: Full Family Communication: Directly with patient Disposition Plan: Remain in step down  Consultants: None  Procedures: None  Antibiotics: Vancomycin IV 8/4 >>>8/5 Primaxin IV 8/4 >>> Ciprofloxacin IV 8/4 >>>8/5  HPI/Subjective: Patient alert and endorses feels much better than yesterday but still feels very weak. Denies abdominal pain, nausea or vomiting or diarrhea. States no correlation of back pain with eating and states the back pain has actually resolved after coming to the ED and being admitted to the hospital. As discussed above he is not having any pain, numbness, or weakness of the lower extremities. He states he has never been told that he had any disc herniation in the past.   Objective: Blood pressure 114/88, pulse 79, temperature 97.7 F (36.5 C), temperature source Oral, resp. rate 17, height 5\' 11"  (1.803 m), weight 101.4 kg (223 lb 8.7 oz), SpO2 92.00%.  Intake/Output Summary (Last 24 hours) at 06/04/12 1423 Last data filed at 06/04/12 0815  Gross per 24 hour  Intake   2060 ml  Output    475 ml  Net   1585 ml     Exam: General: No acute respiratory distress, appears pale and slightly toxic Lungs: Clear to auscultation bilaterally without wheezes or crackles, room air Cardiovascular: Regular rate and rhythm without murmur gallop or rub normal S1 and S2 Abdomen: Nontender, nondistended, soft, bowel sounds positive, no rebound, no ascites, no appreciable mass Musculoskeletal: No significant cyanosis, clubbing of extremities Neurological: Patient alert and oriented x3, moves all extremities x4, exam nonfocal  Data Reviewed: Basic Metabolic Panel:  Lab 06/04/12 0981 06/03/12 1135  NA 138 141  K 3.6 3.8  CL 106 103  CO2 22 23  GLUCOSE 122* 151*  BUN 22 16    CREATININE 1.18 1.30  CALCIUM 8.1* 8.9  MG -- --  PHOS -- --   Liver Function Tests:  Lab 06/04/12 0450  AST 82*  ALT 69*  ALKPHOS 89  BILITOT 2.2*  PROT 5.4*  ALBUMIN 2.6*   CBC:  Lab 06/04/12 0450 06/03/12 1135  WBC 17.6* 16.1*  NEUTROABS -- 15.3*  HGB 14.6 16.8  HCT 44.3 49.2  MCV 85.5 85.3  PLT 110* 157   Cardiac Enzymes:  Lab 06/03/12 1135  CKTOTAL 123  CKMB 2.0  CKMBINDEX --  TROPONINI <0.30    Recent Results (from the past 240 hour(s))  CULTURE, BLOOD (ROUTINE X 2)     Status: Normal (Preliminary result)   Collection Time   06/03/12  3:20 PM      Component Value Range Status Comment   Specimen Description BLOOD LEFT ARM   Final    Special Requests BOTTLES DRAWN AEROBIC AND ANAEROBIC   Final    Culture  Setup Time 06/03/2012 20:23   Final    Culture     Final    Value: GRAM NEGATIVE RODS     Note: Gram Stain Report Called to,Read Back By and Verified With: [facility] MILLS @ 1032 06/04/12 BY KRAWS   Report Status PENDING   Incomplete   MRSA PCR SCREENING     Status: Normal   Collection Time   06/03/12  9:00 PM      Component Value Range Status Comment   MRSA by PCR NEGATIVE  NEGATIVE Final  Studies:  Recent x-ray studies have been reviewed in detail by the Attending Physician  Scheduled Meds:  Reviewed in detail by the Attending Physician   Junious Silk, ANP [facility] Office  [phone] Pager [phone]  On-Call/Text Page:      [email or web address]      password [email or web address]  If 7PM-7AM, please contact night-coverage [email or web address] Password [email or web address] 06/04/2012, 2:23 PM   LOS: 1 day   I have personally examined this patient and reviewed the entire database. I have reviewed the above note, made any necessary editorial changes, and agree with its content.  Lonia Blood, MD [facility]

## 2012-06-04 NOTE — Progress Notes (Signed)
PHARMACIST - PHYSICIAN COMMUNICATION DR:   Sharon Seller, Ella Jubilee.  CONCERNING:  Pt is on Primaxin 500mg  IV q8.  With his weight and renal function, more appropriate dosing is q6.  DESCRIPTION: Will adjust his dose to q6.  Marisue Humble, PharmD Clinical Pharmacist [facility]

## 2012-06-04 NOTE — Progress Notes (Signed)
ANTICOAGULATION CONSULT NOTE - Follow Up Consult  Pharmacy Consult for Coumadin Indication: atrial fibrillation  Allergies  Allergen Reactions  . Penicillins     unknown    Patient Measurements: Height: 5\' 11"  (180.3 cm) Weight: 223 lb 8.7 oz (101.4 kg) IBW/kg (Calculated) : 75.3  Heparin Dosing Weight:   Vital Signs: Temp: 98 F (36.7 C) (08/05 0742) Temp src: Oral (08/05 0742) BP: 90/58 mmHg (08/05 0316) Pulse Rate: 79  (08/05 0742)  Labs:  Basename 06/04/12 0450 06/03/12 2002 06/03/12 1135  HGB 14.6 -- 16.8  HCT 44.3 -- 49.2  PLT 110* -- 157  APTT -- -- --  LABPROT 34.2* 32.1* --  INR 3.32* 3.06* --  HEPARINUNFRC -- -- --  CREATININE 1.18 -- 1.30  CKTOTAL -- -- 123  CKMB -- -- 2.0  TROPONINI -- -- <0.30    Estimated Creatinine Clearance: 72.6 ml/min (by C-G formula based on Cr of 1.18).   Medications:  Scheduled:    . sodium chloride   Intravenous Once  . acetaminophen  1,000 mg Oral Once  . allopurinol  300 mg Oral Daily  . amiodarone  200 mg Oral BID  . ciprofloxacin  400 mg Intravenous Once  . ibuprofen  400 mg Oral Once  . imipenem-cilastatin  500 mg Intravenous Q8H  . multivitamin with minerals  1 tablet Oral Daily  . sodium chloride  500 mL Intravenous Once  . sodium chloride  500 mL Intravenous Once  . sodium chloride  3 mL Intravenous Q12H  . sodium chloride  3 mL Intravenous Q12H  . vancomycin  1,000 mg Intravenous Q12H  . warfarin  2.5 mg Oral Once  . Warfarin - Pharmacist Dosing Inpatient   Does not apply q1800    Assessment: 66yo male with AFib.  INR elevated on home dose of Coumadin at 3.32 this AM.  Hg is 14.6 and pltc 110.  No bleeding problems noted.  Goal of Therapy:  Heparin level 0.3-0.7 units/ml Monitor platelets by anticoagulation protocol: Yes   Plan:  1.  No Coumadin today 2.  Continue daily INR  Marisue Humble, PharmD Clinical Pharmacist [facility]

## 2012-06-05 ENCOUNTER — Inpatient Hospital Stay (HOSPITAL_COMMUNITY): Payer: No Typology Code available for payment source

## 2012-06-05 ENCOUNTER — Encounter (HOSPITAL_COMMUNITY): Payer: Self-pay | Admitting: General Surgery

## 2012-06-05 DIAGNOSIS — E860 Dehydration: Secondary | ICD-10-CM

## 2012-06-05 DIAGNOSIS — K8000 Calculus of gallbladder with acute cholecystitis without obstruction: Secondary | ICD-10-CM

## 2012-06-05 LAB — URINE CULTURE: Colony Count: NO GROWTH

## 2012-06-05 LAB — HEPATITIS PANEL, ACUTE
HCV Ab: NEGATIVE
Hep A IgM: NEGATIVE
Hep B C IgM: NEGATIVE
Hepatitis B Surface Ag: NEGATIVE

## 2012-06-05 LAB — COMPREHENSIVE METABOLIC PANEL
AST: 71 U/L — ABNORMAL HIGH (ref 0–37)
Albumin: 2.8 g/dL — ABNORMAL LOW (ref 3.5–5.2)
Chloride: 104 mEq/L (ref 96–112)
Creatinine, Ser: 0.9 mg/dL (ref 0.50–1.35)
Potassium: 3.2 mEq/L — ABNORMAL LOW (ref 3.5–5.1)
Total Bilirubin: 2.5 mg/dL — ABNORMAL HIGH (ref 0.3–1.2)

## 2012-06-05 LAB — TYPE AND SCREEN
ABO/RH(D): A NEG
Antibody Screen: NEGATIVE

## 2012-06-05 LAB — MAGNESIUM: Magnesium: 1.7 mg/dL (ref 1.5–2.5)

## 2012-06-05 LAB — CBC WITH DIFFERENTIAL/PLATELET
Eosinophils Absolute: 0 10*3/uL (ref 0.0–0.7)
Eosinophils Relative: 0 % (ref 0–5)
Lymphs Abs: 0.6 10*3/uL — ABNORMAL LOW (ref 0.7–4.0)
MCH: 28.6 pg (ref 26.0–34.0)
MCV: 83.8 fL (ref 78.0–100.0)
Monocytes Absolute: 0.4 10*3/uL (ref 0.1–1.0)
Monocytes Relative: 3 % (ref 3–12)
Platelets: 99 10*3/uL — ABNORMAL LOW (ref 150–400)
RBC: 5.32 MIL/uL (ref 4.22–5.81)

## 2012-06-05 LAB — ABO/RH: ABO/RH(D): A NEG

## 2012-06-05 LAB — PROTIME-INR
Prothrombin Time: 26.6 seconds — ABNORMAL HIGH (ref 11.6–15.2)
Prothrombin Time: 19.4 seconds — ABNORMAL HIGH (ref 11.6–15.2)

## 2012-06-05 MED ORDER — VITAMIN K1 10 MG/ML IJ SOLN
5.0000 mg | Freq: Once | INTRAMUSCULAR | Status: AC
  Administered 2012-06-05: 5 mg via SUBCUTANEOUS
  Filled 2012-06-05: qty 0.5

## 2012-06-05 MED ORDER — IBUPROFEN 400 MG PO TABS
400.0000 mg | ORAL_TABLET | ORAL | Status: AC
  Administered 2012-06-05: 400 mg via ORAL
  Filled 2012-06-05: qty 1

## 2012-06-05 MED ORDER — SODIUM CHLORIDE 0.9 % IV BOLUS (SEPSIS)
2000.0000 mL | Freq: Once | INTRAVENOUS | Status: AC
  Administered 2012-06-05: 2000 mL via INTRAVENOUS

## 2012-06-05 MED ORDER — POTASSIUM CHLORIDE CRYS ER 20 MEQ PO TBCR
40.0000 meq | EXTENDED_RELEASE_TABLET | Freq: Once | ORAL | Status: AC
  Administered 2012-06-05: 40 meq via ORAL
  Filled 2012-06-05: qty 2

## 2012-06-05 MED ORDER — IOHEXOL 300 MG/ML  SOLN
100.0000 mL | Freq: Once | INTRAMUSCULAR | Status: AC | PRN
  Administered 2012-06-05: 100 mL via INTRAVENOUS

## 2012-06-05 MED ORDER — MAGNESIUM SULFATE 50 % IJ SOLN
2.0000 g | Freq: Once | INTRAVENOUS | Status: AC
  Administered 2012-06-05: 2 g via INTRAVENOUS
  Filled 2012-06-05: qty 4

## 2012-06-05 MED ORDER — PHYTONADIONE 5 MG PO TABS
10.0000 mg | ORAL_TABLET | Freq: Once | ORAL | Status: DC
  Filled 2012-06-05: qty 2

## 2012-06-05 MED ORDER — IOHEXOL 300 MG/ML  SOLN
20.0000 mL | INTRAMUSCULAR | Status: AC
  Administered 2012-06-05 (×2): 20 mL via ORAL

## 2012-06-05 MED ORDER — POTASSIUM PHOSPHATE DIBASIC 3 MMOLE/ML IV SOLN
10.0000 mmol | Freq: Once | INTRAVENOUS | Status: AC
  Administered 2012-06-05: 10 mmol via INTRAVENOUS
  Filled 2012-06-05: qty 3.33

## 2012-06-05 NOTE — Progress Notes (Signed)
Pt sent to Ultrasound, HR controlled., VSS.

## 2012-06-05 NOTE — Progress Notes (Signed)
Called Dr Marchelle Gearing to notify of lab results as ordered, new orders to follow

## 2012-06-05 NOTE — Progress Notes (Signed)
Pt Hr has been 110's- 140"s Afib when had been 70's-100's Afib, 2355 Temp was 101.6  650  Tylenol given per order L [place]  paged to notify of HR will continue to watch and see if increased rate is a febrile response

## 2012-06-05 NOTE — Consult Note (Signed)
Howard Pearson 15-Nov-1945  [id number].   Primary Care MD: None Requesting MD: Dr. Reather Littler Chief Complaint/Reason for Consult: evaluate for cholecystitis HPI: This is a 66 yo WM who developed low back pain on Friday night.  He took a vicodin and it resolved.  It was fine on Saturday, but Saturday night around 0400am it came back.  He developed fevers, chills, and general malaise.  His wife called 911 and he was brought to the hospital.  He was found to have SIRS and a SBP in the 90s.  He originally had an MRI of his lumbar spine due to concern for diskitis or abscess.  This was negative.  He then had an abdominal ultrasound despite not complaining of any abdominal pain which actually revealed gallbladder wall thickening and a gallstone in the neck of the gallbladder.  He has some slight elevation of his LFTs with a TB of 2.5.  He currently still denies abdominal pain.  He denies nausea, vomiting, diarrhea, CP, SOB, and dysuria.  Due to his U/S findings we were asked to see for further evaluation.  ROS: please see HPI, otherwise all other systems are negative.  No family history on file.  Past Medical History  Diagnosis Date  . Hypertension   . Atrial fibrillation   . Arthritis     Past Surgical History  Procedure Date  . Finger tendon repair     Social History:  reports that he quit smoking about 24 years ago. He does not have any smokeless tobacco history on file. His alcohol and drug histories not on file.  Allergies:  Allergies  Allergen Reactions  . Penicillins     unknown    Medications Prior to Admission  Medication Sig Dispense Refill  . allopurinol (ZYLOPRIM) 300 MG tablet Take 300 mg by mouth daily.      Marland Kitchen amiodarone (PACERONE) 200 MG tablet Take 200 mg by mouth 2 (two) times daily.      Marland Kitchen diltiazem (CARDIZEM CD) 240 MG 24 hr capsule Take 240 mg by mouth daily.      Marland Kitchen HYDROcodone-acetaminophen (VICODIN) 5-500 MG per tablet Take 1-2 tablets by mouth every 6 (six)  hours as needed. For pain      . Multiple Vitamin (MULTIVITAMIN WITH MINERALS) TABS Take 1 tablet by mouth daily.      Marland Kitchen warfarin (COUMADIN) 5 MG tablet Take 5 mg by mouth daily. The patient takes 1 tablet (5 mg) daily EXCEPT for 0.5 tablet (2.5 mg) on Wed/Fri/Sat        Blood pressure 128/92, pulse 58, temperature 97.8 F (36.6 C), temperature source Oral, resp. rate 19, height 5\' 11"  (1.803 m), weight 223 lb 8.7 oz (101.4 kg), SpO2 99.00%. Physical Exam: General: pleasant, WD, WN, obese white male who is laying in bed in NAD, but appears to not feel well. HEENT: head is normocephalic, atraumatic.  Sclera are noninjected.  PERRL.  Ears and nose without any masses or lesions.  Mouth is pink and moist Heart: irregularly irregular.  Normal s1,s2. No obvious murmurs, gallops, or rubs noted.  Palpable radial and pedal pulses bilaterally Lungs: CTAB, no wheezes, rhonchi, or rales noted.  Respiratory effort nonlabored Abd: soft, maybe some very slight tenderness in RUQ, negative murphy's sign, ND, +BS, no masses, hernias, or organomegaly MS: all 4 extremities are symmetrical with no cyanosis, clubbing, or edema. Skin: warm and dry with no masses, lesions, or rashes Psych: A&Ox3 with an appropriate affect.    Results for  orders placed during the hospital encounter of 06/03/12 (from the past 48 hour(s))  URINALYSIS, ROUTINE W REFLEX MICROSCOPIC     Status: Abnormal   Collection Time   06/03/12  2:52 PM      Component Value Range Comment   Color, Urine ORANGE (*) YELLOW BIOCHEMICALS MAY BE AFFECTED BY COLOR   APPearance CLOUDY (*) CLEAR    Specific Gravity, Urine 1.020  1.005 - 1.030    pH 5.5  5.0 - 8.0    Glucose, UA NEGATIVE  NEGATIVE mg/dL    Hgb urine dipstick LARGE (*) NEGATIVE    Bilirubin Urine SMALL (*) NEGATIVE    Ketones, ur 15 (*) NEGATIVE mg/dL    Protein, ur >161 (*) NEGATIVE mg/dL    Urobilinogen, UA 1.0  0.0 - 1.0 mg/dL    Nitrite POSITIVE (*) NEGATIVE    Leukocytes, UA SMALL  (*) NEGATIVE   URINE MICROSCOPIC-ADD ON     Status: Abnormal   Collection Time   06/03/12  2:52 PM      Component Value Range Comment   WBC, UA 0-2  <3 WBC/hpf    RBC / HPF 7-10  <3 RBC/hpf    Bacteria, UA FEW (*) RARE    Casts HYALINE CASTS (*) NEGATIVE    Urine-Other MUCOUS PRESENT     URINE CULTURE     Status: Normal   Collection Time   06/03/12  2:53 PM      Component Value Range Comment   Specimen Description URINE, CLEAN CATCH      Special Requests NONE      Culture  Setup Time 06/03/2012 20:23      Colony Count NO GROWTH      Culture NO GROWTH      Report Status 06/05/2012 FINAL     CULTURE, BLOOD (ROUTINE X 2)     Status: Normal (Preliminary result)   Collection Time   06/03/12  3:20 PM      Component Value Range Comment   Specimen Description BLOOD LEFT ARM      Special Requests BOTTLES DRAWN AEROBIC AND ANAEROBIC      Culture  Setup Time 06/03/2012 20:23      Culture        Value: GRAM NEGATIVE RODS     Note: Gram Stain Report Called to,Read Back By and Verified With: [facility] @ 1032 06/04/12 BY KRAWS   Report Status PENDING     PROTIME-INR     Status: Abnormal   Collection Time   06/03/12  8:02 PM      Component Value Range Comment   Prothrombin Time 32.1 (*) 11.6 - 15.2 seconds    INR 3.06 (*) 0.00 - 1.49   CULTURE, BLOOD (ROUTINE X 2)     Status: Normal (Preliminary result)   Collection Time   06/03/12  8:03 PM      Component Value Range Comment   Specimen Description BLOOD RIGHT HAND      Special Requests BOTTLES DRAWN AEROBIC AND ANAEROBIC 10CC EA      Culture  Setup Time 06/04/2012 02:52      Culture        Value: GRAM POSITIVE RODS     Note: Gram Stain Report Called to,Read Back By and Verified With: TIM IRBY 06/05/2012 6:29AM YIMSU   Report Status PENDING     CULTURE, BLOOD (ROUTINE X 2)     Status: Normal (Preliminary result)   Collection Time   06/03/12  8:03 PM      Component Value Range Comment   Specimen Description BLOOD LEFT HAND      Special  Requests BOTTLES DRAWN AEROBIC ONLY 5CC      Culture  Setup Time 06/04/2012 02:52      Culture        Value:        BLOOD CULTURE RECEIVED NO GROWTH TO DATE CULTURE WILL BE HELD FOR 5 DAYS BEFORE ISSUING A FINAL NEGATIVE REPORT   Report Status PENDING     PROCALCITONIN     Status: Normal   Collection Time   06/03/12  8:04 PM      Component Value Range Comment   Procalcitonin 28.61     CORTISOL     Status: Normal   Collection Time   06/03/12  8:05 PM      Component Value Range Comment   Cortisol, Plasma 31.8     MRSA PCR SCREENING     Status: Normal   Collection Time   06/03/12  9:00 PM      Component Value Range Comment   MRSA by PCR NEGATIVE  NEGATIVE   COMPREHENSIVE METABOLIC PANEL     Status: Abnormal   Collection Time   06/04/12  4:50 AM      Component Value Range Comment   Sodium 138  135 - 145 mEq/L    Potassium 3.6  3.5 - 5.1 mEq/L    Chloride 106  96 - 112 mEq/L    CO2 22  19 - 32 mEq/L    Glucose, Bld 122 (*) 70 - 99 mg/dL    BUN 22  6 - 23 mg/dL    Creatinine, Ser 1.19  0.50 - 1.35 mg/dL    Calcium 8.1 (*) 8.4 - 10.5 mg/dL    Total Protein 5.4 (*) 6.0 - 8.3 g/dL    Albumin 2.6 (*) 3.5 - 5.2 g/dL    AST 82 (*) 0 - 37 U/L    ALT 69 (*) 0 - 53 U/L    Alkaline Phosphatase 89  39 - 117 U/L    Total Bilirubin 2.2 (*) 0.3 - 1.2 mg/dL    GFR calc non Af Amer 62 (*) >90 mL/min    GFR calc Af Amer 71 (*) >90 mL/min   CBC     Status: Abnormal   Collection Time   06/04/12  4:50 AM      Component Value Range Comment   WBC 17.6 (*) 4.0 - 10.5 K/uL    RBC 5.18  4.22 - 5.81 MIL/uL    Hemoglobin 14.6  13.0 - 17.0 g/dL    HCT 14.7  82.9 - 56.2 %    MCV 85.5  78.0 - 100.0 fL    MCH 28.2  26.0 - 34.0 pg    MCHC 33.0  30.0 - 36.0 g/dL    RDW 13.0 (*) 86.5 - 15.5 %    Platelets 110 (*) 150 - 400 K/uL   PROTIME-INR     Status: Abnormal   Collection Time   06/04/12  4:50 AM      Component Value Range Comment   Prothrombin Time 34.2 (*) 11.6 - 15.2 seconds    INR 3.32 (*) 0.00 - 1.49     HEPATITIS PANEL, ACUTE     Status: Normal (Preliminary result)   Collection Time   06/04/12  2:33 PM      Component Value Range Comment   Hepatitis B Surface Ag NEGATIVE  NEGATIVE    HCV Ab NEGATIVE  NEGATIVE    Hep A IgM PENDING  NEGATIVE    Hep B C IgM PENDING  NEGATIVE   PROTIME-INR     Status: Abnormal   Collection Time   06/05/12  1:52 AM      Component Value Range Comment   Prothrombin Time 26.6 (*) 11.6 - 15.2 seconds    INR 2.41 (*) 0.00 - 1.49   COMPREHENSIVE METABOLIC PANEL     Status: Abnormal   Collection Time   06/05/12  1:52 AM      Component Value Range Comment   Sodium 137  135 - 145 mEq/L    Potassium 3.2 (*) 3.5 - 5.1 mEq/L    Chloride 104  96 - 112 mEq/L    CO2 19  19 - 32 mEq/L    Glucose, Bld 121 (*) 70 - 99 mg/dL    BUN 18  6 - 23 mg/dL    Creatinine, Ser 1.61  0.50 - 1.35 mg/dL    Calcium 8.3 (*) 8.4 - 10.5 mg/dL    Total Protein 6.1  6.0 - 8.3 g/dL    Albumin 2.8 (*) 3.5 - 5.2 g/dL    AST 71 (*) 0 - 37 U/L    ALT 71 (*) 0 - 53 U/L    Alkaline Phosphatase 134 (*) 39 - 117 U/L    Total Bilirubin 2.5 (*) 0.3 - 1.2 mg/dL    GFR calc non Af Amer 85 (*) >90 mL/min    GFR calc Af Amer >90  >90 mL/min   CBC WITH DIFFERENTIAL     Status: Abnormal   Collection Time   06/05/12  1:52 AM      Component Value Range Comment   WBC 11.6 (*) 4.0 - 10.5 K/uL    RBC 5.32  4.22 - 5.81 MIL/uL    Hemoglobin 15.2  13.0 - 17.0 g/dL    HCT 09.6  04.5 - 40.9 %    MCV 83.8  78.0 - 100.0 fL    MCH 28.6  26.0 - 34.0 pg    MCHC 34.1  30.0 - 36.0 g/dL    RDW 81.1 (*) 91.4 - 15.5 %    Platelets 99 (*) 150 - 400 K/uL CONSISTENT WITH PREVIOUS RESULT   Neutrophils Relative 91 (*) 43 - 77 %    Neutro Abs 10.6 (*) 1.7 - 7.7 K/uL    Lymphocytes Relative 5 (*) 12 - 46 %    Lymphs Abs 0.6 (*) 0.7 - 4.0 K/uL    Monocytes Relative 3  3 - 12 %    Monocytes Absolute 0.4  0.1 - 1.0 K/uL    Eosinophils Relative 0  0 - 5 %    Eosinophils Absolute 0.0  0.0 - 0.7 K/uL    Basophils Relative 0   0 - 1 %    Basophils Absolute 0.0  0.0 - 0.1 K/uL   PROCALCITONIN     Status: Normal   Collection Time   06/05/12  1:57 AM      Component Value Range Comment   Procalcitonin 25.15     ABO/RH     Status: Normal   Collection Time   06/05/12  2:02 AM      Component Value Range Comment   ABO/RH(D) A NEG     MAGNESIUM     Status: Normal   Collection Time   06/05/12  2:07 AM      Component Value  Range Comment   Magnesium 1.7  1.5 - 2.5 mg/dL   PHOSPHORUS     Status: Abnormal   Collection Time   06/05/12  2:07 AM      Component Value Range Comment   Phosphorus 2.2 (*) 2.3 - 4.6 mg/dL   LACTIC ACID, PLASMA     Status: Abnormal   Collection Time   06/05/12  2:07 AM      Component Value Range Comment   Lactic Acid, Venous 2.7 (*) 0.5 - 2.2 mmol/L    Mr Lumbar Spine W Wo Contrast  06/03/2012  *RADIOLOGY REPORT*  Clinical Data: Back pain.  Sepsis.  Question diskitis.  MRI LUMBAR SPINE WITHOUT AND WITH CONTRAST  Technique:  Multiplanar and multiecho pulse sequences of the lumbar spine were obtained without and with intravenous contrast.  Contrast: 20mL MULTIHANCE GADOBENATE DIMEGLUMINE 529 MG/ML IV SOLN the  Comparison: None.  Findings: Normal signal is present in the conus medullaris which terminates at L1-2, within normal limits. Slight retrolisthesis is present at L1-2.  Marrow signal, vertebral body heights, and alignment are otherwise normal.  Limited imaging of the abdomen is unremarkable.  No significant disc edema is present.  There is no pathologic enhancement of the discs or endplates.  T12-L1:  Negative.  L1-2:  A broad-based disc herniation is present.  Mild facet hypertrophy is evident.  Mild right foraminal narrowing is evident.  L2-3:  A mild broad-based disc bulge is present.  There is no significant stenosis.  L3-4:  A mild broad-based disc bulge is present.  Short pedicles are evident.  There is no significant stenosis.  L4-5:  A broad-based disc herniation is asymmetric to the left. Mild  facet hypertrophy and short pedicles are noted.  This contributes to mild left foraminal narrowing.  L5-S1:  A shallow central disc protrusion is associated with an annular tear.  There is no significant stenosis.  IMPRESSION:  1.  No abnormal disc edema or enhancement to suggest diskitis. 2.  Slight retrolisthesis and disc herniation at L1-2 with mild right foraminal narrowing. 3.  Leftward disc herniation at L4-5 with short pedicles and facet hypertrophy results in mild left foraminal narrowing. 4.  Disc bulging and short pedicles at L2-3 and L3-4 without significant stenosis. 5.  Shallow central disc protrusion and annular tear at L5-S1 without significant stenosis.  Original Report Authenticated By: Jamesetta Orleans. MATTERN, M.D.   US Abdomen Complete  06/05/2012  *RADIOLOGY REPORT*  Clinical Data:  Increased LFTs, question cholecystitis  COMPLETE ABDOMINAL ULTRASOUND  Comparison:  Renal ultrasound 06/03/2012  Findings:  Gallbladder:  There is a gallstone in gallbladder neck region measures 2.2 cm.  Thickening of gallbladder wall up to 6.4 mm thickness.  Echogenic foci of gallbladder wall probable represents cholesterol polyps.  There is no sonographic Murphy's sign.  Common bile duct:  Measures 6.3 mm in diameter.  Liver:  No focal lesion identified.  Within normal limits in parenchymal echogenicity.  IVC:  Appears normal.  Pancreas:  Limited assessment due to abundant bowel gas  Spleen:  Measures measures 9 cm in length.  Accessory splenule is noted measures 1.2 x 1 cm.  Right Kidney:  Measures 11.6 cm in length.  No hydronephrosis or diagnostic renal calculus  Left Kidney:  Measures 11.5 cm in length.  No hydronephrosis. Echogenic foci within parenchyma may represent nonobstructive calcifications of vascular calcifications.  The largest measures 6 mm.  Abdominal aorta:  No aneurysm identified. Measures up to 2.9 cm in diameter.  Small  bilateral pleural effusion.  Trace abdominal ascites.  IMPRESSION:  1.   There is a gallstone in gallbladder neck region measures 2.4 cm.  Thickening of gallbladder wall up to 6.4 mm.  Although there is no sonographic Murphy's sign clinical correlation is necessary to exclude early cholecystitis.  Echogenic foci of gallbladder wall may represent cholesterol polyps. 2.  Normal CBD. 3.  No hydronephrosis. 4.  Echogenic foci within the left renal parenchyma may represent nonobstructive calculi or vascular calcifications.  Original Report Authenticated By: Natasha Mead, M.D.   US Renal  06/03/2012  *RADIOLOGY REPORT*  Clinical Data: UTI, back pain  RENAL/URINARY TRACT ULTRASOUND COMPLETE  Comparison:  06/03/2012 MRI  Findings:  Right Kidney:  12.1 cm length.  Normal cortex and echogenicity.  No focal abnormality or mass. No hydronephrosis.  Left Kidney:  Normal cortex and echogenicity.  11.7 cm length.  No hydronephrosis.  Sub centimeter echogenic cortical foci in the upper pole and also in the lower pole without shadowing.  These are nonspecific but could represent small nonshadowing nonobstructing stones versus vascular calcifications.  Bladder:  Normal appearance by ultrasound.  Ureteral jets demonstrated bilaterally.  Prostate gland is enlarged measuring 5 x 5 x 6 cm.  IMPRESSION: No acute finding or hydronephrosis.  Prostate enlargement  Original Report Authenticated By: Judie Petit. Ruel Favors, M.D.   Dg Abd Portable 1v  06/05/2012  *RADIOLOGY REPORT*  Clinical Data: Deflated the liver function test.  Cholelithiasis.  PORTABLE ABDOMEN - 1 VIEW  Comparison: Ultrasound dated 06/05/2012  Findings: There are no dilated loops of large or small bowel.  The gallstone visible on ultrasound exam is not appreciable on this abdominal radiograph.  No visible free air or free fluid on this supine radiograph.  No acute osseous abnormality.  IMPRESSION: Benign-appearing abdomen.  Original Report Authenticated By: Gwynn Burly, M.D.       Assessment/Plan 1. SIRS, cause unknown, possible  cholecystitis  2. A. Fib Patient Active Problem List  Diagnosis  . SIRS (systemic inflammatory response syndrome)  . UTI (lower urinary tract infection)  . Atrial fibrillation with controlled ventricular response  . Gout  . Hypotension  . Transaminitis  . Dehydration  . Leukocytosis  . BPH (benign prostatic hyperplasia)  . Lumbar disc herniation  . Fever  . Thrombocytopenia  . Gram-negative bacteremia   Plan: 1. D/W Dr. Ezzard Standing.  This may be likely secondary to his gallbladder, but given the unconventional presentation of low back pain we would like to order a CT scan to make sure that nothing else is going on prior surgical intervention.  He is on coumadin with an elevated INR, which the hospitalist have happily agreed to reverse for [place], if needed.  We will await his CT scan results and make further recommendations after this.  Thank you for this consultation.  OSBORNE,KELLY E 06/05/2012, 1:03 PM  Agree with above. This appears to be cholecystitis, but because of somewhat atypical presentation, will obtain CT scan. Patient needs anti-coagulation reversed. (PT/INR - 26/2.4 - 06/05/2012) If CT shows no new findings, will schedule cholecystectomy based on normal coags. Discussed with patient.  Ovidio Kin, MD, [facility] Pager: [phone] Office phone:  [phone]

## 2012-06-05 NOTE — Progress Notes (Signed)
[facility] Progress Note [place] TEAM 1 - Stepdown/ICU TEAM   HAWTHORNE DAY JWJ:[id number] DOB: 1946-10-15 DOA: 06/03/2012 PCP: No primary provider on file.  Brief narrative: 66 year old male patient with chronic atrial fibrillation on Coumadin as well as hypertension. Was in usual state of health when he awakened at 2 AM on the morning of 06/02/2012 with low back pain. He had some leftover Vicodin which he took and this improved his back pain. He had no urinary symptoms such as frequency or dysuria. Throughout the remainder of the day he apparently felt okay but at 4 AM 06/03/2012 he again woke up this time with shaking chills, fever and he felt sweaty. He went back to sleep but when he awakened later in the morning he felt very weak and tired. Later in the morning he had further chills and was unable to eat. He sat down in a chair and wasn't as active as usual. His wife he came concerned about his overall status so she called 911. The patient was transported to [facility] emergency department where his systolic blood pressure was found to be relatively low in the 90s and the patient was tachycardic with a heart rate of 117. Physical exam was otherwise unremarkable. The patient had a mild leukocytosis of 16,000 but his hemoglobin seemed to be elevated and hemoconcentrated at 16.8. His lactic acid was also elevated at 3.5 and his urinalysis appeared consistent with an acute urinary tract infection. Chest x-ray was negative for any source of infection or any volume overload. Because he had a pattern of hypertension that responded to IV fluids with a likely source of infection from the urinary tract it was felt the patient had systemic inflammatory response syndrome and was admitted to the step down unit for further monitoring and evaluation. Pulmonary critical care medicine was consulted and did not feel that the patient met criteria for septic shock protocol.  Assessment/Plan:  SIRS (systemic  inflammatory response syndrome)/Leukocytosis/ Fever *Continue supportive care *Continue hydration and empiric antibiotics to cover gram-negative organisms with likely source suspected to be urinary tract *Despite fever once again with associated mild hypotension and significant tachycardia which has resolved after fluid challenge *Procalcitonin is persistently greater than 20  ? UTI (lower urinary tract infection) *Urine culture negative so no urinary source to the infectious process  Thrombocytopenia *Likely secondary to gram-negative infection *Continue to follow CBC  Gram-negative bacteremia/Gram-positive rods bacteremia *Blood culture 1 positive for gram-negative rods and blood culture #3 positive for gram-positive rod *Likely source of gr neg rods is from gastrointestinal tract- Gr + rods are a common skin contaminant *Abdominal ultrasound completed earlier this morning demonstrates thickened gallbladder wall as well as a very large gallstone obstructing the gallbladder neck. Agree with surgical team that CT abdomen and pelvis that needs to be completed to rule out other sources of infection.   Cholelithiasis with acute on chronic cholecystitis withTransaminitis *Ultrasound reveals large gallstone as well as thickened gallbladder wall consistent with chronic cholecystitis *Obstructive transaminitis which has not improved is likely reflective of acute cholecystitis *Tentative plans are to proceed with surgical intervention 06/06/2012 *Acute hepatitis panel pending *Plain abdominal x-ray showed no evidence of obstruction or ileus *Decrease diet to clear liquids due to anorexia  Atrial fibrillation with controlled ventricular response *At presentation was slightly tachycardic but with rehydration tachycardia has  resolved *Because of hypotension all rate control agents currently are on hold *Monitor for rebound tachycardia/beta blocker withdrawal and if blood pressure can support can  provide when necessary IV Lopressor  Hypotension/ Dehydration *Multi-factorial related to gram-negative infection as well as volume depletion *Systolic blood pressure did dip into the 80s this morning so I have given 500 cc normal saline bolus earlier and blood pressure is increased to greater than 110 systolic with adequate urine output *Continue maintenance IV fluids at 150 cc per hour  Chronic warfarin anticoagulation with Mild coagulopathy *Patient on chronic Coumadin and INR has increased slightly since admission and likely do to acute infection as well as antibiotic usage *Management per pharmacy *Since plans are to proceed with surgery on 06/06/2012 we'll give 1 dose of vitamin K 5 mg s/c today and get 2 units of FFP and follow PT/INR   BPH (benign prostatic hyperplasia)  Lumbar disc herniation *Seen on MRI spine this admission-no infection/abscess or osteomyelitis seen *Patient denies lower strategy weakness, numbness, tingling or pain second of for additional workup to primary care physician  Gout   DVT prophylaxis: Chronically anticoagulated on Coumadin with therapeutic INR at presentation Code Status: Full Family Communication: Directly with patient Disposition Plan: Remain in step down  Consultants: None  Procedures: None  Antibiotics: Vancomycin IV 8/4 >>>8/5 Primaxin IV 8/4 >>> Ciprofloxacin IV 8/4 >>>8/5  HPI/Subjective: Alert and reports although feels better than he did at home still is not at baseline. Also endorses a sensation of fullness.was quite anorexic and was only able to eat one piece of toast this morning. Denies abdominal pain, nausea, vomiting or diarrhea.    Objective: Blood pressure 128/92, pulse 58, temperature 97.8 F (36.6 C), temperature source Oral, resp. rate 19, height 5\' 11"  (1.803 m), weight 101.4 kg (223 lb 8.7 oz), SpO2 99.00%.  Intake/Output Summary (Last 24 hours) at 06/05/12 1302 Last data filed at 06/05/12 0726  Gross per 24  hour  Intake   2535 ml  Output    551 ml  Net   1984 ml     Exam: General: No acute respiratory distress, appears pale and slightly toxic Lungs: Clear to auscultation bilaterally without wheezes or crackles, room air Cardiovascular: Regular rate and rhythm without murmur gallop or rub normal S1 and S2 Abdomen: Nontender, nondistended, soft, bowel sounds positive, no rebound, no ascites, no appreciable mass Musculoskeletal: No significant cyanosis, clubbing of extremities Neurological: Patient alert and oriented x3, moves all extremities x4, exam nonfocal  Data Reviewed: Basic Metabolic Panel:  Lab 06/05/12 1610 06/05/12 0152 06/04/12 0450 06/03/12 1135  NA -- 137 138 141  K -- 3.2* 3.6 3.8  CL -- 104 106 103  CO2 -- 19 22 23   GLUCOSE -- 121* 122* 151*  BUN -- 18 22 16   CREATININE -- 0.90 1.18 1.30  CALCIUM -- 8.3* 8.1* 8.9  MG 1.7 -- -- --  PHOS 2.2* -- -- --   Liver Function Tests:  Lab 06/05/12 0152 06/04/12 0450  AST 71* 82*  ALT 71* 69*  ALKPHOS 134* 89  BILITOT 2.5* 2.2*  PROT 6.1 5.4*  ALBUMIN 2.8* 2.6*   CBC:  Lab 06/05/12 0152 06/04/12 0450 06/03/12 1135  WBC 11.6* 17.6* 16.1*  NEUTROABS 10.6* -- 15.3*  HGB 15.2 14.6 16.8  HCT 44.6 44.3 49.2  MCV 83.8 85.5 85.3  PLT 99* 110* 157   Cardiac Enzymes:  Lab 06/03/12 1135  CKTOTAL 123  CKMB 2.0  CKMBINDEX --  TROPONINI <0.30    Recent Results (from the past 240 hour(s))  URINE CULTURE     Status: Normal   Collection Time   06/03/12  2:53 PM      Component Value Range Status Comment   Specimen Description URINE, CLEAN CATCH   Final    Special Requests NONE   Final    Culture  Setup Time 06/03/2012 20:23   Final    Colony Count NO GROWTH   Final    Culture NO GROWTH   Final    Report Status 06/05/2012 FINAL   Final   CULTURE, BLOOD (ROUTINE X 2)     Status: Normal (Preliminary result)   Collection Time   06/03/12  3:20 PM      Component Value Range Status Comment   Specimen Description BLOOD LEFT  ARM   Final    Special Requests BOTTLES DRAWN AEROBIC AND ANAEROBIC   Final    Culture  Setup Time 06/03/2012 20:23   Final    Culture     Final    Value: GRAM NEGATIVE RODS     Note: Gram Stain Report Called to,Read Back By and Verified With: [facility] MILLS @ 1032 06/04/12 BY KRAWS   Report Status PENDING   Incomplete   CULTURE, BLOOD (ROUTINE X 2)     Status: Normal (Preliminary result)   Collection Time   06/03/12  8:03 PM      Component Value Range Status Comment   Specimen Description BLOOD RIGHT HAND   Final    Special Requests BOTTLES DRAWN AEROBIC AND ANAEROBIC 10CC EA   Final    Culture  Setup Time 06/04/2012 02:52   Final    Culture     Final    Value: GRAM POSITIVE RODS     Note: Gram Stain Report Called to,Read Back By and Verified With: TIM IRBY 06/05/2012 6:29AM YIMSU   Report Status PENDING   Incomplete   CULTURE, BLOOD (ROUTINE X 2)     Status: Normal (Preliminary result)   Collection Time   06/03/12  8:03 PM      Component Value Range Status Comment   Specimen Description BLOOD LEFT HAND   Final    Special Requests BOTTLES DRAWN AEROBIC ONLY 5CC   Final    Culture  Setup Time 06/04/2012 02:52   Final    Culture     Final    Value:        BLOOD CULTURE RECEIVED NO GROWTH TO DATE CULTURE WILL BE HELD FOR 5 DAYS BEFORE ISSUING A FINAL NEGATIVE REPORT   Report Status PENDING   Incomplete   MRSA PCR SCREENING     Status: Normal   Collection Time   06/03/12  9:00 PM      Component Value Range Status Comment   MRSA by PCR NEGATIVE  NEGATIVE Final      Studies:  Recent x-ray studies have been reviewed in detail by the Attending Physician  Scheduled Meds:  Reviewed in detail by the Attending Physician   Junious Silk, ANP [facility] Office  [phone] Pager [phone]  On-Call/Text Page:      [email or web address]      password [email or web address]  If 7PM-7AM, please contact night-coverage [email or web address] Password [email or web address] 06/05/2012, 1:02 PM   LOS: 2 days   I have examined  the patient, reviewed the chart and discussed the situation with the patient and his family. I have modified the above note which I agree with.   Calvert Cantor, MD [phone]

## 2012-06-05 NOTE — Progress Notes (Signed)
eLink Physician-Brief Progress Note Patient Name: Howard Pearson DOB: [birth year] MRN: [id number]  Date of Service  06/05/2012   HPI/Events of Note    Lab 06/05/12 0207 06/05/12 0152 06/04/12 0450 06/03/12 1135  NA -- 137 138 141  K -- 3.2* 3.6 --  CL -- 104 106 103  CO2 -- 19 22 23   GLUCOSE -- 121* 122* 151*  BUN -- 18 22 16   CREATININE -- 0.90 1.18 1.30  CALCIUM -- 8.3* 8.1* 8.9  MG 1.7 -- -- --  PHOS 2.2* -- -- --   .lactate 2.7 and poor clearance   Lab 06/05/12 0157 06/03/12 2004  PROCALCITON 25.15 28.61   SEpsis  Maintaing BP but poor lactate and PCT clearance  eICU Interventions  Correct phos, mag, and k abnormalities Aggressive fluid hydration Monitor High risk ICU transfer patient - bedside MD to consider PCCM consult   Intervention Category Major Interventions: Sepsis - evaluation and management;Electrolyte abnormality - evaluation and management  Manual Navarra 06/05/2012, 4:16 AM

## 2012-06-05 NOTE — Progress Notes (Signed)
Pt called me into his room having rigors,  Hr increased 150's rechecked temp down to 99.6  02 placed on pt at 4 liters, 2 mg morphine  See mar.  Dr Marchelle Gearing  cammed in  ordered 2 liter fluid bolus, and stat labs. Will continue to monitor

## 2012-06-05 NOTE — Progress Notes (Signed)
First unit of FFP 340 started  Ended 450  VS in doc flowsheets.  Pt tolerated well

## 2012-06-05 NOTE — Progress Notes (Signed)
Utilization review completed.  

## 2012-06-05 NOTE — Progress Notes (Signed)
Paged L Harduk to notify of the events since my last call she noted Dr Marchelle Gearing notes will continue to monitor

## 2012-06-05 NOTE — Progress Notes (Signed)
Back from ultrasound, back on monitor- VSS,

## 2012-06-05 NOTE — Progress Notes (Signed)
Pt to CT scan. VSS. Family to wait in room.

## 2012-06-05 NOTE — Progress Notes (Signed)
ANTICOAGULATION CONSULT NOTE - Follow Up Consult  Pharmacy Consult:  Coumadin Indication: atrial fibrillation  Allergies  Allergen Reactions  . Penicillins     unknown    Patient Measurements: Height: 5\' 11"  (180.3 cm) Weight: 223 lb 8.7 oz (101.4 kg) IBW/kg (Calculated) : 75.3   Vital Signs: Temp: 97.6 F (36.4 C) (08/06 0726) Temp src: Oral (08/06 1200) BP: 128/92 mmHg (08/06 1200) Pulse Rate: 58  (08/06 1200)  Labs:  Basename 06/05/12 0152 06/04/12 0450 06/03/12 2002 06/03/12 1135  HGB 15.2 14.6 -- --  HCT 44.6 44.3 -- 49.2  PLT 99* 110* -- 157  APTT -- -- -- --  LABPROT 26.6* 34.2* 32.1* --  INR 2.41* 3.32* 3.06* --  HEPARINUNFRC -- -- -- --  CREATININE 0.90 1.18 -- 1.30  CKTOTAL -- -- -- 123  CKMB -- -- -- 2.0  TROPONINI -- -- -- <0.30    Estimated Creatinine Clearance: 95.2 ml/min (by C-G formula based on Cr of 0.9).      Assessment: 73 YOM with Afib on Coumadin PTA and was admitted with supratherapeutic INR.  INR currently therapeutic but is to be reversed for possible surgery.  No bleeding reported.   Goal of Therapy:  INR 2-3 Monitor platelets by anticoagulation protocol: Yes     Plan:  - Continue to hold Coumadin - Daily PT / INR - Continue Primaxin 500mg  IV Q6H - Monitor renal fxn, clinical course - F/U plans     Amish Mintzer D. Laney Potash, PharmD, BCPS Pager:  [phone] 06/05/2012, 1:28 PM

## 2012-06-05 NOTE — Progress Notes (Addendum)
eLink Physician-Brief Progress Note Patient Name: Howard Pearson DOB: 23-Aug-1946 MRN: [id number]  Date of Service  06/05/2012   HPI/Events of Note  Already in imipenem. Now suddent temp spike to 101-102F, Rigors +. ? tachypneic v rigors. BP 133 sb with ma 97. HR 140   eICU Interventions  Recheck lactate, pct Chck stat labs 2L fluid bolus and reassess Ibuprofen x 1 for fever   Intervention Category Major Interventions: Sepsis - evaluation and management  Bhavya Grand 06/05/2012, 1:45 AM

## 2012-06-06 ENCOUNTER — Inpatient Hospital Stay (HOSPITAL_COMMUNITY): Payer: No Typology Code available for payment source | Admitting: Anesthesiology

## 2012-06-06 ENCOUNTER — Inpatient Hospital Stay (HOSPITAL_COMMUNITY): Payer: No Typology Code available for payment source

## 2012-06-06 ENCOUNTER — Encounter (HOSPITAL_COMMUNITY): Payer: Self-pay | Admitting: Anesthesiology

## 2012-06-06 ENCOUNTER — Encounter (HOSPITAL_COMMUNITY)
Admission: EM | Disposition: A | Discharge status: Home Health | Payer: Self-pay | Source: Home / Self Care | Attending: Internal Medicine

## 2012-06-06 DIAGNOSIS — R6510 Systemic inflammatory response syndrome (SIRS) of non-infectious origin without acute organ dysfunction: Secondary | ICD-10-CM

## 2012-06-06 HISTORY — PX: CHOLECYSTECTOMY: SHX55

## 2012-06-06 LAB — COMPREHENSIVE METABOLIC PANEL
ALT: 52 U/L (ref 0–53)
Alkaline Phosphatase: 210 U/L — ABNORMAL HIGH (ref 39–117)
BUN: 14 mg/dL (ref 6–23)
CO2: 20 mEq/L (ref 19–32)
Chloride: 105 mEq/L (ref 96–112)
GFR calc Af Amer: >90 mL/min (ref >90)
GFR calc non Af Amer: >90 mL/min (ref >90)
Glucose, Bld: 97 mg/dL (ref 70–99)
Potassium: 3.5 mEq/L (ref 3.5–5.1)
Sodium: 139 mEq/L (ref 135–145)
Total Bilirubin: 4.1 mg/dL — ABNORMAL HIGH (ref 0.3–1.2)
Total Protein: 6.2 g/dL (ref 6.0–8.3)

## 2012-06-06 LAB — CBC WITH DIFFERENTIAL/PLATELET
Basophils Absolute: 0 10*3/uL (ref 0.0–0.1)
Eosinophils Relative: 0 % (ref 0–5)
HCT: 40.1 % (ref 39.0–52.0)
Hemoglobin: 13.7 g/dL (ref 13.0–17.0)
Lymphocytes Relative: 8 % — ABNORMAL LOW (ref 12–46)
Lymphs Abs: 1.2 10*3/uL (ref 0.7–4.0)
MCV: 83.4 fL (ref 78.0–100.0)
Monocytes Absolute: 1.5 10*3/uL — ABNORMAL HIGH (ref 0.1–1.0)
Neutro Abs: 12.3 10*3/uL — ABNORMAL HIGH (ref 1.7–7.7)
RBC: 4.81 MIL/uL (ref 4.22–5.81)
RDW: 16.9 % — ABNORMAL HIGH (ref 11.5–15.5)
WBC: 15 10*3/uL — ABNORMAL HIGH (ref 4.0–10.5)

## 2012-06-06 LAB — PREPARE FRESH FROZEN PLASMA

## 2012-06-06 LAB — HEPATIC FUNCTION PANEL
Albumin: 2.3 g/dL — ABNORMAL LOW (ref 3.5–5.2)
Alkaline Phosphatase: 200 U/L — ABNORMAL HIGH (ref 39–117)
Total Protein: 5.7 g/dL — ABNORMAL LOW (ref 6.0–8.3)

## 2012-06-06 LAB — PROTIME-INR: Prothrombin Time: 18.4 seconds — ABNORMAL HIGH (ref 11.6–15.2)

## 2012-06-06 LAB — CULTURE, BLOOD (ROUTINE X 2)

## 2012-06-06 SURGERY — LAPAROSCOPIC CHOLECYSTECTOMY WITH INTRAOPERATIVE CHOLANGIOGRAM
Anesthesia: General | Site: Abdomen | Wound class: Clean Contaminated

## 2012-06-06 MED ORDER — VECURONIUM BROMIDE 10 MG IV SOLR
INTRAVENOUS | Status: DC | PRN
  Administered 2012-06-06: 1 mg via INTRAVENOUS
  Administered 2012-06-06: 2 mg via INTRAVENOUS
  Administered 2012-06-06: 4 mg via INTRAVENOUS
  Administered 2012-06-06: 1 mg via INTRAVENOUS

## 2012-06-06 MED ORDER — BUPIVACAINE-EPINEPHRINE 0.25% -1:200000 IJ SOLN
INTRAMUSCULAR | Status: DC | PRN
  Administered 2012-06-06: 30 mL

## 2012-06-06 MED ORDER — METOPROLOL TARTRATE 1 MG/ML IV SOLN: 5.0000 mg | Freq: Four times a day (QID) | INTRAVENOUS | Status: DC | PRN

## 2012-06-06 MED ORDER — FENTANYL CITRATE 0.05 MG/ML IJ SOLN
INTRAMUSCULAR | Status: DC | PRN
  Administered 2012-06-06: 50 ug via INTRAVENOUS
  Administered 2012-06-06: 200 ug via INTRAVENOUS

## 2012-06-06 MED ORDER — AMIODARONE HCL IN DEXTROSE 360-4.14 MG/200ML-% IV SOLN
0.5000 mg/min | INTRAVENOUS | Status: DC
  Administered 2012-06-06: 0.5 mg/min via INTRAVENOUS
  Administered 2012-06-06: 60 mg/h via INTRAVENOUS
  Filled 2012-06-06 (×6): qty 200

## 2012-06-06 MED ORDER — SODIUM CHLORIDE 0.9 % IV SOLN
INTRAVENOUS | Status: DC | PRN
  Administered 2012-06-06: 13:00:00

## 2012-06-06 MED ORDER — ONDANSETRON HCL 4 MG/2ML IJ SOLN: 4.0000 mg | Freq: Once | INTRAMUSCULAR | Status: DC | PRN

## 2012-06-06 MED ORDER — KCL IN DEXTROSE-NACL 20-5-0.45 MEQ/L-%-% IV SOLN
INTRAVENOUS | Status: DC
  Administered 2012-06-06: 20:00:00 via INTRAVENOUS
  Administered 2012-06-07: 50 mL/h via INTRAVENOUS
  Administered 2012-06-07: 06:00:00 via INTRAVENOUS
  Administered 2012-06-07: 50 mL/h via INTRAVENOUS
  Administered 2012-06-08 – 2012-06-10 (×2): via INTRAVENOUS
  Filled 2012-06-06 (×9): qty 1000

## 2012-06-06 MED ORDER — ONDANSETRON HCL 4 MG PO TABS: 4.0000 mg | ORAL_TABLET | Freq: Four times a day (QID) | ORAL | Status: DC | PRN

## 2012-06-06 MED ORDER — AMIODARONE HCL IN DEXTROSE 360-4.14 MG/200ML-% IV SOLN
1.0000 mg/min | INTRAVENOUS | Status: AC
  Administered 2012-06-06 (×2): 1 mg/min via INTRAVENOUS
  Filled 2012-06-06: qty 200

## 2012-06-06 MED ORDER — HYDROMORPHONE HCL PF 1 MG/ML IJ SOLN: 0.2500 mg | INTRAMUSCULAR | Status: DC | PRN

## 2012-06-06 MED ORDER — SODIUM CHLORIDE 0.9 % IV SOLN
200.0000 ug | INTRAVENOUS | Status: DC | PRN
  Administered 2012-06-06: .7 ug/kg/h via INTRAVENOUS

## 2012-06-06 MED ORDER — ONDANSETRON HCL 4 MG/2ML IJ SOLN: 4.0000 mg | Freq: Four times a day (QID) | INTRAMUSCULAR | Status: DC | PRN

## 2012-06-06 MED ORDER — DEXMEDETOMIDINE HCL IN NACL 200 MCG/50ML IV SOLN
0.4000 ug/kg/h | INTRAVENOUS | Status: DC
  Filled 2012-06-06: qty 50

## 2012-06-06 MED ORDER — ARTIFICIAL TEARS OP OINT
TOPICAL_OINTMENT | OPHTHALMIC | Status: DC | PRN
  Administered 2012-06-06: 1 via OPHTHALMIC

## 2012-06-06 MED ORDER — GLYCOPYRROLATE 0.2 MG/ML IJ SOLN
INTRAMUSCULAR | Status: DC | PRN
  Administered 2012-06-06: .5 mg via INTRAVENOUS

## 2012-06-06 MED ORDER — ONDANSETRON HCL 4 MG/2ML IJ SOLN
INTRAMUSCULAR | Status: DC | PRN
  Administered 2012-06-06: 4 mg via INTRAVENOUS

## 2012-06-06 MED ORDER — SODIUM CHLORIDE 0.9 % IR SOLN
Status: DC | PRN
  Administered 2012-06-06: 3000 mL

## 2012-06-06 MED ORDER — PROPOFOL 10 MG/ML IV EMUL
INTRAVENOUS | Status: DC | PRN
  Administered 2012-06-06: 100 mg via INTRAVENOUS

## 2012-06-06 MED ORDER — SUCCINYLCHOLINE CHLORIDE 20 MG/ML IJ SOLN
INTRAMUSCULAR | Status: DC | PRN
  Administered 2012-06-06: 120 mg via INTRAVENOUS

## 2012-06-06 MED ORDER — ESMOLOL HCL 10 MG/ML IV SOLN
INTRAVENOUS | Status: DC | PRN
  Administered 2012-06-06: 30 mg via INTRAVENOUS
  Administered 2012-06-06 (×2): 20 mg via INTRAVENOUS

## 2012-06-06 MED ORDER — SODIUM CHLORIDE 0.9 % IJ SOLN
INTRAMUSCULAR | Status: AC
  Administered 2012-06-06: 10 mL
  Filled 2012-06-06: qty 40

## 2012-06-06 MED ORDER — NEOSTIGMINE METHYLSULFATE 1 MG/ML IJ SOLN
INTRAMUSCULAR | Status: DC | PRN
  Administered 2012-06-06: 4 mg via INTRAVENOUS
  Administered 2012-06-06: 1 mg via INTRAVENOUS

## 2012-06-06 MED ORDER — MORPHINE SULFATE 2 MG/ML IJ SOLN
1.0000 mg | INTRAMUSCULAR | Status: DC | PRN
  Administered 2012-06-06 (×2): 2 mg via INTRAVENOUS
  Filled 2012-06-06 (×2): qty 1

## 2012-06-06 MED ORDER — LACTATED RINGERS IV SOLN
INTRAVENOUS | Status: DC | PRN
  Administered 2012-06-06 (×2): via INTRAVENOUS

## 2012-06-06 MED ORDER — BUPIVACAINE-EPINEPHRINE PF 0.25-1:200000 % IJ SOLN
INTRAMUSCULAR | Status: AC
  Filled 2012-06-06: qty 30

## 2012-06-06 MED ORDER — MORPHINE SULFATE 10 MG/ML IJ SOLN
INTRAMUSCULAR | Status: DC | PRN
  Administered 2012-06-06: 2 mg via INTRAVENOUS

## 2012-06-06 MED ORDER — ENOXAPARIN SODIUM 40 MG/0.4ML ~~LOC~~ SOLN
40.0000 mg | SUBCUTANEOUS | Status: DC
  Administered 2012-06-07 – 2012-06-10 (×4): 40 mg via SUBCUTANEOUS
  Filled 2012-06-06 (×5): qty 0.4

## 2012-06-06 MED ORDER — SODIUM CHLORIDE 0.9 % IV SOLN
INTRAVENOUS | Status: DC | PRN
  Administered 2012-06-06 (×2): via INTRAVENOUS

## 2012-06-06 SURGICAL SUPPLY — 59 items
ADH SKN CLS APL DERMABOND .7 (GAUZE/BANDAGES/DRESSINGS) ×2
APPLIER CLIP ROT 10 11.4 M/L (STAPLE) ×3
APR CLP MED LRG 11.4X10 (STAPLE) ×2
BAG SPEC RTRVL LRG 6X4 10 (ENDOMECHANICALS) ×2
BLADE SURG ROTATE 9660 (MISCELLANEOUS) ×2 IMPLANT
CANISTER SUCTION 2500CC (MISCELLANEOUS) ×3 IMPLANT
CHLORAPREP W/TINT 26ML (MISCELLANEOUS) ×3 IMPLANT
CHOLANGIOGRAM CATH TAUT (CATHETERS) ×3 IMPLANT
CLIP APPLIE ROT 10 11.4 M/L (STAPLE) ×2 IMPLANT
CLOTH BEACON ORANGE TIMEOUT ST (SAFETY) ×3 IMPLANT
COVER MAYO STAND STRL (DRAPES) ×3 IMPLANT
COVER SURGICAL LIGHT HANDLE (MISCELLANEOUS) ×3 IMPLANT
DECANTER SPIKE VIAL GLASS SM (MISCELLANEOUS) ×3 IMPLANT
DERMABOND ADVANCED (GAUZE/BANDAGES/DRESSINGS) ×1
DERMABOND ADVANCED .7 DNX12 (GAUZE/BANDAGES/DRESSINGS) ×2 IMPLANT
DRAIN CHANNEL 19F RND (DRAIN) ×2 IMPLANT
DRAPE C-ARM 42X72 X-RAY (DRAPES) ×3 IMPLANT
DRAPE PROXIMA HALF (DRAPES) ×2 IMPLANT
DRAPE UTILITY 15X26 W/TAPE STR (DRAPE) ×6 IMPLANT
ELECT REM PT RETURN 9FT ADLT (ELECTROSURGICAL) ×3
ELECTRODE REM PT RTRN 9FT ADLT (ELECTROSURGICAL) ×2 IMPLANT
EVACUATOR SILICONE 100CC (DRAIN) ×2 IMPLANT
FILTER SMOKE EVAC LAPAROSHD (FILTER) ×3 IMPLANT
GAUZE SPONGE 2X2 8PLY STRL LF (GAUZE/BANDAGES/DRESSINGS) ×1 IMPLANT
GLOVE BIO SURGEON STRL SZ7.5 (GLOVE) ×2 IMPLANT
GLOVE BIOGEL PI IND STRL 7.0 (GLOVE) ×1 IMPLANT
GLOVE BIOGEL PI IND STRL 7.5 (GLOVE) ×1 IMPLANT
GLOVE BIOGEL PI INDICATOR 7.0 (GLOVE) ×1
GLOVE BIOGEL PI INDICATOR 7.5 (GLOVE) ×1
GLOVE ECLIPSE 6.0 STRL STRAW (GLOVE) ×2 IMPLANT
GLOVE SS BIOGEL STRL SZ 6.5 (GLOVE) ×1 IMPLANT
GLOVE SUPERSENSE BIOGEL SZ 6.5 (GLOVE) ×1
GLOVE SURG SIGNA 7.5 PF LTX (GLOVE) ×5 IMPLANT
GOWN STRL NON-REIN LRG LVL3 (GOWN DISPOSABLE) ×12 IMPLANT
GOWN STRL REIN XL XLG (GOWN DISPOSABLE) ×3 IMPLANT
IV CATH 14GX2 1/4 (CATHETERS) ×3 IMPLANT
KIT BASIN OR (CUSTOM PROCEDURE TRAY) ×3 IMPLANT
KIT ROOM TURNOVER OR (KITS) ×3 IMPLANT
NS IRRIG 1000ML POUR BTL (IV SOLUTION) ×3 IMPLANT
PAD ARMBOARD 7.5X6 YLW CONV (MISCELLANEOUS) ×6 IMPLANT
POUCH SPECIMEN RETRIEVAL 10MM (ENDOMECHANICALS) ×3 IMPLANT
SCISSORS LAP 5X35 DISP (ENDOMECHANICALS) IMPLANT
SET IRRIG TUBING LAPAROSCOPIC (IRRIGATION / IRRIGATOR) ×3 IMPLANT
SLEEVE Z-THREAD 5X100MM (TROCAR) ×3 IMPLANT
SPECIMEN JAR SMALL (MISCELLANEOUS) ×3 IMPLANT
SPONGE GAUZE 2X2 STER 10/PKG (GAUZE/BANDAGES/DRESSINGS) ×1
SPONGE GAUZE 4X4 12PLY (GAUZE/BANDAGES/DRESSINGS) ×2 IMPLANT
STOPCOCK 4 WAY LG BORE MALE ST (IV SETS) ×3 IMPLANT
SUT ETHILON 2 0 FS 18 (SUTURE) ×2 IMPLANT
SUT VIC AB 5-0 PS2 18 (SUTURE) ×3 IMPLANT
TAPE CLOTH SURG 4X10 WHT LF (GAUZE/BANDAGES/DRESSINGS) ×2 IMPLANT
TOWEL OR 17X24 6PK STRL BLUE (TOWEL DISPOSABLE) ×3 IMPLANT
TOWEL OR 17X26 10 PK STRL BLUE (TOWEL DISPOSABLE) ×3 IMPLANT
TRAY LAPAROSCOPIC (CUSTOM PROCEDURE TRAY) ×3 IMPLANT
TROCAR XCEL BLUNT TIP 100MML (ENDOMECHANICALS) ×3 IMPLANT
TROCAR Z-THREAD FIOS 11X100 BL (TROCAR) ×3 IMPLANT
TROCAR Z-THREAD FIOS 5X100MM (TROCAR) ×7 IMPLANT
TUBING EXTENTION W/L.L. (IV SETS) ×3 IMPLANT
WATER STERILE IRR 1000ML POUR (IV SOLUTION) IMPLANT

## 2012-06-06 NOTE — Progress Notes (Signed)
General Surgery Note  LOS: 3 days  Room - 3311  Assessment/Plan: 1.  Cholecystitis  Plan - Cholecystectomy today.  I discussed with the patient the indications and risks of gall bladder surgery.  The primary risks of gall bladder surgery include, but are not limited to, bleeding, infection, common bile duct injury, and open surgery. Because of the liver abscess, the patient is at increased risk for open surgery.  There is also the risk that the patient may have continued symptoms after surgery.  However, the likelihood of improvement in symptoms and return to the patient's normal status is good. We discussed the typical post-operative recovery course. I tried to answer the patient's questions.   I spoke to his wife by phone.  2.  Liver abscess adjacent to GB - I should be able to take care of this at surgery.  3.  SIRS (systemic inflammatory response syndrome)  infection) - better  On Primaxin 4.  Atrial fibrillation with rapid ventricular rate - he is running in the 110's to 120's 5.  Thrombocytopenia  Platelets 99,000 - 06/05/2012  Note: admission platelets were 157,000 - 06/03/2012  Will recheck CBC this AM 6.  Anti-coagulation  PT/INR - 18.4/1.50 - 06/06/2012 - I will give him one more unit of FFP before surgery today  7.  Gram-negative bacteremia -  By my review of the chart, the patient had gram positive rods in his blood - 06/03/2012  8.  Gout  9. BPH (benign prostatic hyperplasia)  10.  Lumbar disc herniation  11.  History of hypertension  Subjective:  Still feels puny, but with surprising little abdominal pain. Objective:   Filed Vitals:   06/06/12 0345  BP: 133/87  Pulse: 78  Temp: 99.4 F (37.4 C)  Resp: 19     Intake/Output from previous day:  08/06 0701 - 08/07 0700 In: 1950 [I.V.:1750; IV Piggyback:200] Out: 1277 [Urine:1276; Stool:1]  Intake/Output this shift:      Physical Exam:   General: WN obese WM who is alert and oriented.    HEENT: Normal. Pupils  equal. .   Lungs: Clear   Abdomen: Soft with essentially no tenderness.  BS present   Neurologic:  Grossly intact to motor and sensory function.   Psychiatric: Has normal mood and affect.   Lab Results:    Basename 06/05/12 0152 06/04/12 0450  WBC 11.6* 17.6*  HGB 15.2 14.6  HCT 44.6 44.3  PLT 99* 110*    BMET   Basename 06/06/12 0340 06/05/12 0152  NA 139 137  K 3.5 3.2*  CL 105 104  CO2 20 19  GLUCOSE 97 121*  BUN 14 18  CREATININE 0.78 0.90  CALCIUM 8.7 8.3*    PT/INR   Basename 06/06/12 0340 06/05/12 2143  LABPROT 18.4* 19.4*  INR 1.50* 1.61*    ABG  No results found for this basename: PHART:2,PCO2:2,PO2:2,HCO3:2 in the last 72 hours   Studies/Results:  US Abdomen Complete  06/05/2012  *RADIOLOGY REPORT*  Clinical Data:  Increased LFTs, question cholecystitis  COMPLETE ABDOMINAL ULTRASOUND  Comparison:  Renal ultrasound 06/03/2012  Findings:  Gallbladder:  There is a gallstone in gallbladder neck region measures 2.2 cm.  Thickening of gallbladder wall up to 6.4 mm thickness.  Echogenic foci of gallbladder wall probable represents cholesterol polyps.  There is no sonographic Murphy's sign.  Common bile duct:  Measures 6.3 mm in diameter.  Liver:  No focal lesion identified.  Within normal limits in parenchymal echogenicity.  IVC:  Appears normal.  Pancreas:  Limited assessment due to abundant bowel gas  Spleen:  Measures measures 9 cm in length.  Accessory splenule is noted measures 1.2 x 1 cm.  Right Kidney:  Measures 11.6 cm in length.  No hydronephrosis or diagnostic renal calculus  Left Kidney:  Measures 11.5 cm in length.  No hydronephrosis. Echogenic foci within parenchyma may represent nonobstructive calcifications of vascular calcifications.  The largest measures 6 mm.  Abdominal aorta:  No aneurysm identified. Measures up to 2.9 cm in diameter.  Small bilateral pleural effusion.  Trace abdominal ascites.  IMPRESSION:  1.  There is a gallstone in gallbladder neck region  measures 2.4 cm.  Thickening of gallbladder wall up to 6.4 mm.  Although there is no sonographic Murphy's sign clinical correlation is necessary to exclude early cholecystitis.  Echogenic foci of gallbladder wall may represent cholesterol polyps. 2.  Normal CBD. 3.  No hydronephrosis. 4.  Echogenic foci within the left renal parenchyma may represent nonobstructive calculi or vascular calcifications.  Original Report Authenticated By: Natasha Mead, M.D.   Ct Abdomen Pelvis W Contrast  06/05/2012  **ADDENDUM** CREATED: 06/05/2012 18:25:06  Critical Value/emergent results were called by telephone at the time of interpretation on 06/05/2012 at 6:25 p.m. to Dr. Luisa Hart, who verbally acknowledged these results.  **END ADDENDUM** SIGNED BY: Allayne Gitelman. Jena Gauss, M.D.   06/05/2012  *RADIOLOGY REPORT*  Clinical Data: Fever, leukocytosis, and elevated liver enzymes. Cholelithiasis.  CT ABDOMEN AND PELVIS WITH CONTRAST  Technique:  Multidetector CT imaging of the abdomen and pelvis was performed following the standard protocol during bolus administration of intravenous contrast.  Contrast: OMNIPAQUE IOHEXOL 300 MG/ML  SOLN  Comparison: Ultrasound dated 06/05/2012  Findings: There is an area of abnormal low density with peripheral slight enhancement in the left lobe of the liver immediately above the gallbladder.  This measures 3.0 x 1.7 by 1.7 cm and has consistent with a hepatic abscess.  The gallbladder is distended with a 2 cm stone in the neck of the gallbladder with diffuse thickening and edema of the gallbladder wall.  There are no dilated bile ducts.  There is minimal atelectasis at the lung bases with tiny effusions.  The spleen, pancreas, adrenal glands, and kidneys with straight no acute abnormality.  There are several small stones in the lower pole of the right kidney.  There is a small amount of ascites in the paracolic gutters.  There are no dilated loops of large or small bowel.  Terminal ileum and appendix are  normal.  No diverticular disease.  No significant adenopathy.  IMPRESSION: Probable acute cholecystitis with an adjacent small abscess in the left lobe of the liver.  Small amount of ascites.  Original Report Authenticated By: Gwynn Burly, M.D.   Dg Abd Portable 1v  06/05/2012  *RADIOLOGY REPORT*  Clinical Data: Deflated the liver function test.  Cholelithiasis.  PORTABLE ABDOMEN - 1 VIEW  Comparison: Ultrasound dated 06/05/2012  Findings: There are no dilated loops of large or small bowel.  The gallstone visible on ultrasound exam is not appreciable on this abdominal radiograph.  No visible free air or free fluid on this supine radiograph.  No acute osseous abnormality.  IMPRESSION: Benign-appearing abdomen.  Original Report Authenticated By: Gwynn Burly, M.D.     Anti-infectives:   Anti-infectives     Start     Dose/Rate Route Frequency Ordered Stop   06/04/12 2000   imipenem-cilastatin (PRIMAXIN) 500 mg in sodium chloride 0.9 % 100  mL IVPB  Status:  Discontinued        500 mg 200 mL/hr over 30 Minutes Intravenous 4 times per day 06/04/12 1437 06/04/12 1440   06/04/12 1500   imipenem-cilastatin (PRIMAXIN) 500 mg in sodium chloride 0.9 % 100 mL IVPB        500 mg 200 mL/hr over 30 Minutes Intravenous 4 times per day 06/04/12 1440     06/03/12 2200   imipenem-cilastatin (PRIMAXIN) 500 mg in sodium chloride 0.9 % 100 mL IVPB  Status:  Discontinued        500 mg 200 mL/hr over 30 Minutes Intravenous 3 times per day 06/03/12 1810 06/04/12 1437   06/03/12 2000   vancomycin (VANCOCIN) IVPB 1000 mg/200 mL premix  Status:  Discontinued        1,000 mg 200 mL/hr over 60 Minutes Intravenous Every 12 hours 06/03/12 1810 06/04/12 1612   06/03/12 1600   ciprofloxacin (CIPRO) IVPB 400 mg        400 mg 200 mL/hr over 60 Minutes Intravenous  Once 06/03/12 1548 06/03/12 1719          Ovidio Kin, MD, FACS Pager: [phone],   [place] [facility] Office: [phone] 06/06/2012

## 2012-06-06 NOTE — OR Nursing (Signed)
precedex infusing at 0.7 mcg/kg/hr on admission to PACU. Discontinued at 1426 per order Dr. Jean Rosenthal

## 2012-06-06 NOTE — Procedures (Signed)
Extubation: patient responsive, spontaneous ventilation, strong grip and full Train of 4, suctioned oropharynx and extubated to FM O2, good airway, VSS   Sandford Craze, MD

## 2012-06-06 NOTE — Op Note (Signed)
NAME:  DALTIN, CRIST NO.:  [id number]  MEDICAL RECORD NO.:  [id number]  LOCATION:  [facility]                         FACILITY:  [facility]  PHYSICIAN:  Sandria Bales. Ezzard Standing, M.D.  DATE OF BIRTH:  03-Jan-1946  DATE OF PROCEDURE:  06/06/2012                              OPERATIVE REPORT  PREOPERATIVE DIAGNOSES:  Acute cholecystitis, cholelithiasis.  POSTOPERATIVE DIAGNOSES:  Gangrenous cholecystitis with cholelithiasis and the cystic duct/hepatic duct junction is low on cholangiogram.  PROCEDURE:  Laparoscopic cholecystectomy with intraoperative cholangiogram.  SURGEON:  Sandria Bales. Ezzard Standing, M.D.  FIRST ASSISTANT:  Sheryle Spray. Maisie Fus, NP.  ANESTHESIA:  General endotracheal supervised by Dr. Arta Bruce. Local is a 30 mL of 0.25% Marcaine.  ESTIMATED BLOOD LOSS:  Less than 100 mL.  DRAINS:  19 Blake drain.  COMPLICATIONS:  None.  INDICATION FOR PROCEDURE:  Mr. Hellmann is a 66 year old white male who sees Dr. Elias Else, is his primary care doctor who presented to the hospital septic.  Evaluation revealed acute cholecystitis with a questionable liver abscess near the gallbladder.  He now comes for attempted laparoscopic cholecystectomy.  Dr. Michelle Piper was concerned about preoperative clearance and Dr. Eldridge Dace was kind enough to see him as the patient is in chronic atrial fibrillation, seems to be reasonably stable for proceeding with surgery.  The indication, potential complications of gallbladder surgery explained to the patient.  Potential complications include, but are not limited to, bleeding, infection, common bile duct injury, and the need for open surgery.  In fact I quoted for the family about a 50% chance of open surgery.  OPERATIVE NOTE:  The patient was taken to room #1 and underwent a general anesthetic as supervised of Arta Bruce.  He is already on Primaxin, his antibiotic.  His abdomen was prepped with ChloraPrep and sterilely draped.  A time-out was held and  surgical checklist run.  Infraumbilical incision was made with sharp dissection carried down to the abdominal cavity.  I placed a 12-mm Hasson trocar, secured this with 0 Vicryl suture.  I placed a 10-mm trocar in the subxiphoid location, a mid subcostal 5 mm trocar, a 5 mm trocar in the right lateral subcostal, and a 5-mm trocar midway between the umbilicus in the subxiphoid location.  Right and left lobes of liver unremarkable.  The bowel that I could see was unremarkable.  The stomach was unremarkable.  The gallbladder was encased in omentum consistent with acute cholecystitis.  I teased the omentum off using suction irrigation.  I got down to the cystic duct gallbladder junction, identified cystic artery, which I triply Endo clipped and divided.  I placed a clip on the gallbladder side of the cystic duct and shot intraoperative cholangiogram.  Intraoperative cholangiogram was shot using about 10 mL of half-strength Hypaque solution that showed free flow of contrast down the cystic duct into the common bile duct into the duodenum and up the hepatic radicals. There was no obstructing mass or lesion.  I note the cystic duct junction to the common hepatic duct is only about 2 cm from the duodenum, so it is a low junction.  The taut catheter was then removed.  The cystic duct triply Endo clipped  and divided.  The gallbladder was then sharply and bluntly dissected from the gallbladder bed.  There was a question of a liver abscess on the medial aspect of the gallbladder in the low left lobe.  However, during dissection, I did not see any obvious abscess and one this from the inflammation around the gallbladder was interpreted as abscess on CT scan.  The patient did have a gangrenous changes of his gallbladder along the liver wall.  He had a large stone, which was about 2 cm, which had become impacted in the distal common bile duct.  The cystic duct was triply Endo clipped.  The gallbladder  was sharply and bluntly dissected from the gallbladder bed, placed in EndoCatch bag and delivered through the umbilicus.  The abdomen was irrigated with about 4 L of saline.  Because of gangrenous changes of the gallbladder and acute illness with a questionable abscess, I did place a 19-French Blake drain in the gallbladder fossa and sewed this in with 2-0 nylon suture.  I then removed each trocar and turned.  The umbilical port was closed with 0 Vicryl suture.    The skin at each port closed with 5-0 Vicryl suture, and the wound was painted with Dermabond and sterilely dressed.  The patient tolerated the procedure well, was transported to the recovery room in good condition.  Sponge and needle count were correct at the end of the case.   Sandria Bales. Ezzard Standing, M.D., FACS   DHN/MEDQ  D:  06/06/2012  T:  06/06/2012  Job:  [id number]  cc:   Molly Maduro A. Nicholos Johns, M.D.

## 2012-06-06 NOTE — Anesthesia Postprocedure Evaluation (Signed)
  Anesthesia Post-op Note  Patient: Howard Pearson  Procedure(s) Performed: Procedure(s) (LRB): LAPAROSCOPIC CHOLECYSTECTOMY WITH INTRAOPERATIVE CHOLANGIOGRAM (N/A)  Patient Location: PACU  Anesthesia Type: General  Level of Consciousness: awake, alert  and oriented  Airway and Oxygen Therapy: Patient Spontanous Breathing  Post-op Pain: none  Post-op Assessment: Post-op Vital signs reviewed, Patient's Cardiovascular Status Stable, Respiratory Function Stable, Patent Airway, No signs of Nausea or vomiting and Pain level controlled  Post-op Vital Signs: Reviewed and stable  Complications: No apparent anesthesia complications

## 2012-06-06 NOTE — Consult Note (Signed)
Admit date: 06/03/2012 Referring Physician  Dr. Michelle Piper  Primary Cardiologist  Dr. Alanda Amass Katrinka Blazing III Reason for Consultation  preoperative evaluation  HPI: 66 year old man with history of atrial fibrillation.  He was found to be in atrial fibrillation several weeks ago.  He was started on amiodarone.  The plan was to have him on Coumadin for 4 weeks and then for him to undergo outpatient cardioversion.  He was last seen in her office in the middle of July.  He had an echocardiogram in June of 2013 showing an ejection fraction of 40-45% with diffuse hypokinesis.  Overall, he had been feeling well.  He denied any chest discomfort or shortness of breath.  He had been walking about a mile and a half a day.  He noted some occasional, transient episodes of feeling feverish.  They would spontaneously resolve.  He started to feel worse over the last few days.  He was very fatigued.  He came to the hospital and was found to have cholecystitis.  He was scheduled for cholecystectomy today with Dr. Ezzard Standing, but he was in atrial fibrillation with rapid ventricular response.  Intravenous amiodarone was started and his heart rate is been in the 100 to 115 range.  We're asked to further evaluate for perioperative risk.  Currently, the patient denies any chest discomfort or shortness of breath.  He is lying flat comfortably.  In the past, he has never had any type of ischemia evaluation.  He does not remember ever walking on a treadmill for stress test.  Up until a week ago, he was walking daily without any difficulties.  Looking back at the office record, he had a heart monitor and 2010 which showed only rare atrial fibrillation.     PMH:   Past Medical History  Diagnosis Date  . Hypertension   . Atrial fibrillation   . Arthritis      PSH:   Past Surgical History  Procedure Date  . Finger tendon repair     Allergies:  Penicillins Prior to Admit Meds:   Prescriptions prior to admission  Medication Sig  Dispense Refill  . allopurinol (ZYLOPRIM) 300 MG tablet Take 300 mg by mouth daily.      Marland Kitchen amiodarone (PACERONE) 200 MG tablet Take 200 mg by mouth 2 (two) times daily.      Marland Kitchen diltiazem (CARDIZEM CD) 240 MG 24 hr capsule Take 240 mg by mouth daily.      Marland Kitchen HYDROcodone-acetaminophen (VICODIN) 5-500 MG per tablet Take 1-2 tablets by mouth every 6 (six) hours as needed. For pain      . Multiple Vitamin (MULTIVITAMIN WITH MINERALS) TABS Take 1 tablet by mouth daily.      Marland Kitchen warfarin (COUMADIN) 5 MG tablet Take 5 mg by mouth daily. The patient takes 1 tablet (5 mg) daily EXCEPT for 0.5 tablet (2.5 mg) on Wed/Fri/Sat       Fam HX:   No family history on file. Social HX:    History   Social History  . Marital Status: Single    Spouse Name: N/A    Number of Children: N/A  . Years of Education: N/A   Occupational History  . Not on file.   Social History Main Topics  . Smoking status: Former Smoker -- 1.0 packs/day    Quit date: 06/03/1988  . Smokeless tobacco: Not on file  . Alcohol Use: Not on file  . Drug Use:   . Sexually Active:    Other Topics  Concern  . Not on file   Social History Narrative  . No narrative on file     ROS:  All 11 ROS were addressed and are negative except what is stated in the HPI  Physical Exam: Blood pressure 143/96, pulse 113, temperature 99.7 F (37.6 C), temperature source Oral, resp. rate 27, height 5\' 11"  (1.803 m), weight 101.4 kg (223 lb 8.7 oz), SpO2 96.00%.   General: Well developed, well nourished, in no acute distress Head:  Normal cephalic and atramatic  Lungs:   Clear bilaterally to auscultation and percussion. Heart:  Tachycardic, irregularly irregular rhythm.  S1-S2  Msk:   Normal strength and tone for age. Extremities:   No  edema.  Neuro: Alert and oriented X 3. Psych:  Normal affect, responds appropriately    Labs:   Lab Results  Component Value Date   WBC 15.0* 06/06/2012   HGB 13.7 06/06/2012   HCT 40.1 06/06/2012   MCV 83.4  06/06/2012   PLT 108* 06/06/2012    Lab 06/06/12 0340  NA 139  K 3.5  CL 105  CO2 20  BUN 14  CREATININE 0.78  CALCIUM 8.7  PROT 6.2  BILITOT 4.1*  ALKPHOS 210*  ALT 52  AST 50*  GLUCOSE 97   No results found for this basename: PTT   Lab Results  Component Value Date   INR 1.50* 06/06/2012   INR 1.61* 06/05/2012   INR 2.41* 06/05/2012   Lab Results  Component Value Date   CKTOTAL 123 06/03/2012   CKMB 2.0 06/03/2012   TROPONINI <0.30 06/03/2012     No results found for this basename: CHOL   No results found for this basename: HDL   No results found for this basename: LDLCALC   No results found for this basename: TRIG   No results found for this basename: CHOLHDL   No results found for this basename: LDLDIRECT      Radiology:  US Abdomen Complete  06/05/2012  *RADIOLOGY REPORT*  Clinical Data:  Increased LFTs, question cholecystitis  COMPLETE ABDOMINAL ULTRASOUND  Comparison:  Renal ultrasound 06/03/2012  Findings:  Gallbladder:  There is a gallstone in gallbladder neck region measures 2.2 cm.  Thickening of gallbladder wall up to 6.4 mm thickness.  Echogenic foci of gallbladder wall probable represents cholesterol polyps.  There is no sonographic Murphy's sign.  Common bile duct:  Measures 6.3 mm in diameter.  Liver:  No focal lesion identified.  Within normal limits in parenchymal echogenicity.  IVC:  Appears normal.  Pancreas:  Limited assessment due to abundant bowel gas  Spleen:  Measures measures 9 cm in length.  Accessory splenule is noted measures 1.2 x 1 cm.  Right Kidney:  Measures 11.6 cm in length.  No hydronephrosis or diagnostic renal calculus  Left Kidney:  Measures 11.5 cm in length.  No hydronephrosis. Echogenic foci within parenchyma may represent nonobstructive calcifications of vascular calcifications.  The largest measures 6 mm.  Abdominal aorta:  No aneurysm identified. Measures up to 2.9 cm in diameter.  Small bilateral pleural effusion.  Trace abdominal ascites.   IMPRESSION:  1.  There is a gallstone in gallbladder neck region measures 2.4 cm.  Thickening of gallbladder wall up to 6.4 mm.  Although there is no sonographic Murphy's sign clinical correlation is necessary to exclude early cholecystitis.  Echogenic foci of gallbladder wall may represent cholesterol polyps. 2.  Normal CBD. 3.  No hydronephrosis. 4.  Echogenic foci within the left renal parenchyma  may represent nonobstructive calculi or vascular calcifications.  Original Report Authenticated By: Natasha Mead, M.D.   Ct Abdomen Pelvis W Contrast  06/05/2012  **ADDENDUM** CREATED: 06/05/2012 18:25:06  Critical Value/emergent results were called by telephone at the time of interpretation on 06/05/2012 at 6:25 p.m. to Dr. Luisa Hart, who verbally acknowledged these results.  **END ADDENDUM** SIGNED BY: Allayne Gitelman. Jena Gauss, M.D.   06/05/2012  *RADIOLOGY REPORT*  Clinical Data: Fever, leukocytosis, and elevated liver enzymes. Cholelithiasis.  CT ABDOMEN AND PELVIS WITH CONTRAST  Technique:  Multidetector CT imaging of the abdomen and pelvis was performed following the standard protocol during bolus administration of intravenous contrast.  Contrast: OMNIPAQUE IOHEXOL 300 MG/ML  SOLN  Comparison: Ultrasound dated 06/05/2012  Findings: There is an area of abnormal low density with peripheral slight enhancement in the left lobe of the liver immediately above the gallbladder.  This measures 3.0 x 1.7 by 1.7 cm and has consistent with a hepatic abscess.  The gallbladder is distended with a 2 cm stone in the neck of the gallbladder with diffuse thickening and edema of the gallbladder wall.  There are no dilated bile ducts.  There is minimal atelectasis at the lung bases with tiny effusions.  The spleen, pancreas, adrenal glands, and kidneys with straight no acute abnormality.  There are several small stones in the lower pole of the right kidney.  There is a small amount of ascites in the paracolic gutters.  There are no dilated  loops of large or small bowel.  Terminal ileum and appendix are normal.  No diverticular disease.  No significant adenopathy.  IMPRESSION: Probable acute cholecystitis with an adjacent small abscess in the left lobe of the liver.  Small amount of ascites.  Original Report Authenticated By: Gwynn Burly, M.D.   Dg Abd Portable 1v  06/05/2012  *RADIOLOGY REPORT*  Clinical Data: Deflated the liver function test.  Cholelithiasis.  PORTABLE ABDOMEN - 1 VIEW  Comparison: Ultrasound dated 06/05/2012  Findings: There are no dilated loops of large or small bowel.  The gallstone visible on ultrasound exam is not appreciable on this abdominal radiograph.  No visible free air or free fluid on this supine radiograph.  No acute osseous abnormality.  IMPRESSION: Benign-appearing abdomen.  Original Report Authenticated By: Gwynn Burly, M.D.    EKG:  Atrial fibrillation with rapid ventricular response, diffuse nonspecific ST segment changes  ASSESSMENT: Atrial fibrillation with rapid ventricular response, perioperative evaluation  PLAN:  #1 continue IV amiodarone.  Could add intravenous diltiazem or esmolol for additional rate control during the surgery.  #2 no further cardiac testing required before surgery.  He is not in heart failure.  There are no signs of active ischemia.  He has not shown signs of ischemia prior to his cholecystitis.  His ejection fraction is mildly decreased.  Careful use of IV fluids postoperatively can help reduce fluid overload.  #3 his elective cardioversion which was supposed to be in mid-August will have to be postponed as his Coumadin has been held for this surgery.  Will follow along with you.  Corky Crafts., MD  06/06/2012  12:20 PM

## 2012-06-06 NOTE — Anesthesia Procedure Notes (Addendum)
Procedure Name: Intubation Date/Time: 06/06/2012 11:43 AM Performed by: Luster Landsberg Pre-anesthesia Checklist: Patient identified, Emergency Drugs available, Suction available and Patient being monitored Patient Re-evaluated:Patient Re-evaluated prior to inductionOxygen Delivery Method: Circle system utilized Preoxygenation: Pre-oxygenation with 100% oxygen Intubation Type: IV induction, Rapid sequence and Cricoid Pressure applied Laryngoscope Size: Mac and 3 Grade View: Grade I Tube type: Oral Tube size: 8.0 mm Number of attempts: 1 Airway Equipment and Method: Stylet Placement Confirmation: ETT inserted through vocal cords under direct vision,  positive ETCO2 and breath sounds checked- equal and bilateral Secured at: 22 cm Tube secured with: Tape Dental Injury: Teeth and Oropharynx as per pre-operative assessment

## 2012-06-06 NOTE — Progress Notes (Addendum)
[facility] Progress Note [place] TEAM 1 - Stepdown/ICU TEAM   Howard Pearson LKG:[id number] DOB: 1946-01-25 DOA: 06/03/2012 PCP: No primary provider on file.  Brief narrative: 66 year old male patient with chronic atrial fibrillation on Coumadin as well as hypertension. Was in usual state of health when he awakened at 2 AM on the morning of 06/02/2012 with low back pain. He had some leftover Vicodin which he took and this improved his back pain. He had no urinary symptoms such as frequency or dysuria. Throughout the remainder of the day he apparently felt okay but at 4 AM 06/03/2012 he again woke up this time with shaking chills, fever and he felt sweaty. He went back to sleep but when he awakened later in the morning he felt very weak and tired. Later in the morning he had further chills and was unable to eat. He sat down in a chair and wasn't as active as usual. His wife he came concerned about his overall status so she called 911. The patient was transported to [facility] emergency department where his systolic blood pressure was found to be relatively low in the 90s and the patient was tachycardic with a heart rate of 117. Physical exam was otherwise unremarkable. The patient had a mild leukocytosis of 16,000 but his hemoglobin seemed to be elevated and hemoconcentrated at 16.8. His lactic acid was also elevated at 3.5 and his urinalysis appeared consistent with an acute urinary tract infection. Chest x-ray was negative for any source of infection or any volume overload. Because he had a pattern of hypertension that responded to IV fluids with a likely source of infection from the urinary tract it was felt the patient had systemic inflammatory response syndrome and was admitted to the step down unit for further monitoring and evaluation. Pulmonary critical care medicine was consulted and did not feel that the patient met criteria for septic shock protocol.  Assessment/Plan:  SIRS (systemic  inflammatory response syndrome)/Leukocytosis/ Fever *Continue supportive care - source is now known to be gangrenous cholecystitis  Cholelithiasis with acute on chronic cholecystitis status post laparoscopic cholecystectomy 06/06/2012 *Ultrasound reveals large gallstone as well as thickened gallbladder wall consistent with chronic cholecystitis and CT scan revealed hepatic abscess *Obstructive transaminitis which has not improved is likely reflective of acute cholecystitis *As per general surgery no abscess seen during operation *Acute hepatitis panel negative *Plain abdominal x-ray showed no evidence of obstruction or ileus  ? UTI (lower urinary tract infection) *Urine culture negative   Thrombocytopenia *Likely secondary to gram-negative infection *Continue to follow CBC  Klebsiella gram-negative bacteremia/diphtheroid Gram-positive bacteremia * Klebsiella is pansensitive *Suspected source is gallbladder   Atrial fibrillation with uncontrolled ventricular response *At presentation was slightly tachycardic but with rehydration tachycardia had resolved briefly but this morning having runs of ventricular response up to the 130s even at rest *Because of hypotension preadmission oral Lopressor on hold *Have changed oral amiodarone to IV - have added when necessary Lopressor  *Appreciate cardiology consultation preoperatively for cardiac clearance *Atrial fibrillation was new diagnosis for this patient only a few weeks prior to admission and will eventually undergo elective cardioversion procedure as an outpatient as well as outpatient stress test  Hypotension/ Dehydration *Multi-factorial related to gram-negative infection as well as volume depletion  *Continue maintenance IV fluids at 150 cc per hour  Chronic warfarin anticoagulation with Mild coagulopathy *Patient on chronic Coumadin and INR had increased slightly since admission and likely was due to acute infection as well as  antibiotic  usage *Dr. Ezzard Standing recommends no Coumadin for 2 days but they are ordering Lovenox for now *Management per pharmacy *Reversal of anticoagulation initiated on 06/06/2012 with subcutaneous vitamin K as well as 2 units of FFP. Surgery also ordered additional FFP this morning immediately preceding surgery.  BPH (benign prostatic hyperplasia)  Lumbar disc herniation *Seen on MRI spine this admission-no infection/abscess or osteomyelitis seen *Patient denies lower extremity weakness, numbness, tingling or pain second of for additional workup to primary care physician  Gout   DVT prophylaxis: Chronically anticoagulated on Coumadin with therapeutic INR at presentation. Coumadin reversed for surgery 06/06/2012. Postoperative DVT prophylaxis at discretion of surgery team Code Status: Full Disposition Plan: Remain in step down  Consultants: None  Procedures: None  Antibiotics: Vancomycin IV 8/4 >>>8/5 Primaxin IV 8/4 >>> Ciprofloxacin IV 8/4 >>>8/5  HPI/Subjective: Alert and currently denies abdominal pain, nausea or awareness of tachypalpitations. He continues to endorse sensation of fatigue.   Objective: Blood pressure 143/96, pulse 113, temperature 99.7 F (37.6 C), temperature source Oral, resp. rate 27, height 5\' 11"  (1.803 m), weight 101.4 kg (223 lb 8.7 oz), SpO2 96.00%.  Intake/Output Summary (Last 24 hours) at 06/06/12 1308 Last data filed at 06/06/12 1245  Gross per 24 hour  Intake 3537.5 ml  Output   1550 ml  Net 1987.5 ml     Exam: General: No acute respiratory distress, appears pale Lungs: Clear to auscultation bilaterally without wheezes or crackles, room air Cardiovascular: Iregular rate and rhythm without murmur gallop or rub normal S1 and S2, atrial fibrillation with ventricular rates as high as 120 Abdomen: Nontender, nondistended, soft, bowel sounds positive, no rebound, no ascites, no appreciable mass Musculoskeletal: No significant cyanosis,  clubbing of extremities Neurological: Patient alert and oriented x3, moves all extremities x4, exam nonfocal  Data Reviewed: Basic Metabolic Panel:  Lab 06/06/12 1610 06/05/12 0207 06/05/12 0152 06/04/12 0450 06/03/12 1135  NA 139 -- 137 138 141  K 3.5 -- 3.2* 3.6 3.8  CL 105 -- 104 106 103  CO2 20 -- 19 22 23   GLUCOSE 97 -- 121* 122* 151*  BUN 14 -- 18 22 16   CREATININE 0.78 -- 0.90 1.18 1.30  CALCIUM 8.7 -- 8.3* 8.1* 8.9  MG 2.0 1.7 -- -- --  PHOS -- 2.2* -- -- --   Liver Function Tests:  Lab 06/06/12 0340 06/05/12 0152 06/04/12 0450  AST 50* 71* 82*  ALT 52 71* 69*  ALKPHOS 210* 134* 89  BILITOT 4.1* 2.5* 2.2*  PROT 6.2 6.1 5.4*  ALBUMIN 2.7* 2.8* 2.6*   CBC:  Lab 06/06/12 0750 06/05/12 0152 06/04/12 0450 06/03/12 1135  WBC 15.0* 11.6* 17.6* 16.1*  NEUTROABS 12.3* 10.6* -- 15.3*  HGB 13.7 15.2 14.6 16.8  HCT 40.1 44.6 44.3 49.2  MCV 83.4 83.8 85.5 85.3  PLT 108* 99* 110* 157   Cardiac Enzymes:  Lab 06/03/12 1135  CKTOTAL 123  CKMB 2.0  CKMBINDEX --  TROPONINI <0.30    Recent Results (from the past 240 hour(s))  URINE CULTURE     Status: Normal   Collection Time   06/03/12  2:53 PM      Component Value Range Status Comment   Specimen Description URINE, CLEAN CATCH   Final    Special Requests NONE   Final    Culture  Setup Time 06/03/2012 20:23   Final    Colony Count NO GROWTH   Final    Culture NO GROWTH   Final    Report  Status 06/05/2012 FINAL   Final   CULTURE, BLOOD (ROUTINE X 2)     Status: Normal   Collection Time   06/03/12  3:20 PM      Component Value Range Status Comment   Specimen Description BLOOD LEFT ARM   Final    Special Requests BOTTLES DRAWN AEROBIC AND ANAEROBIC   Final    Culture  Setup Time 06/03/2012 20:23   Final    Culture     Final    Value: KLEBSIELLA PNEUMONIAE     Note: Gram Stain Report Called to,Read Back By and Verified With: [facility] MILLS @ 1032 06/04/12 BY KRAWS   Report Status 06/06/2012 FINAL   Final    Organism  ID, Bacteria KLEBSIELLA PNEUMONIAE   Final   CULTURE, BLOOD (ROUTINE X 2)     Status: Normal (Preliminary result)   Collection Time   06/03/12  8:03 PM      Component Value Range Status Comment   Specimen Description BLOOD RIGHT HAND   Final    Special Requests BOTTLES DRAWN AEROBIC AND ANAEROBIC 10CC EA   Final    Culture  Setup Time 06/04/2012 02:52   Final    Culture     Final    Value: DIPHTHEROIDS(CORYNEBACTERIUM SPECIES)     Note: Standardized susceptibility testing for this organism is not available.     Note: Gram Stain Report Called to,Read Back By and Verified With: TIM IRBY 06/05/2012 6:29AM YIMSU   Report Status PENDING   Incomplete   CULTURE, BLOOD (ROUTINE X 2)     Status: Normal (Preliminary result)   Collection Time   06/03/12  8:03 PM      Component Value Range Status Comment   Specimen Description BLOOD LEFT HAND   Final    Special Requests BOTTLES DRAWN AEROBIC ONLY 5CC   Final    Culture  Setup Time 06/04/2012 02:52   Final    Culture     Final    Value:        BLOOD CULTURE RECEIVED NO GROWTH TO DATE CULTURE WILL BE HELD FOR 5 DAYS BEFORE ISSUING A FINAL NEGATIVE REPORT   Report Status PENDING   Incomplete   MRSA PCR SCREENING     Status: Normal   Collection Time   06/03/12  9:00 PM      Component Value Range Status Comment   MRSA by PCR NEGATIVE  NEGATIVE Final      Studies:  Recent x-ray studies have been reviewed in detail by the Attending Physician  Scheduled Meds:  Reviewed in detail by the Attending Physician   Junious Silk, ANP [facility] Office  [phone] Pager [phone]  On-Call/Text Page:      [email or web address]      password [email or web address]  If 7PM-7AM, please contact night-coverage [email or web address] Password [email or web address] 06/06/2012, 1:08 PM   LOS: 3 days   I have personally examined this patient and reviewed the entire database. I have reviewed the above note, made any necessary editorial changes, and agree with its content.  Lonia Blood,  MD [facility]

## 2012-06-06 NOTE — Anesthesia Preprocedure Evaluation (Addendum)
Anesthesia Evaluation  Patient identified by MRN, date of birth, ID band Patient unresponsive    Airway Mallampati: II TM Distance: >3 FB Neck ROM: Full    Dental  (+) Teeth Intact and Dental Advisory Given   Pulmonary          Cardiovascular hypertension, Pt. on medications + dysrhythmias Atrial Fibrillation Rhythm:Irregular Rate:Tachycardia     Neuro/Psych    GI/Hepatic   Endo/Other    Renal/GU      Musculoskeletal   Abdominal   Peds  Hematology   Anesthesia Other Findings   Reproductive/Obstetrics                           Anesthesia Physical Anesthesia Plan  ASA: III  Anesthesia Plan: General   Post-op Pain Management:    Induction: Intravenous  Airway Management Planned: Oral ETT  Additional Equipment:   Intra-op Plan:   Post-operative Plan: Extubation in OR  Informed Consent: I have reviewed the patients History and Physical, chart, labs and discussed the procedure including the risks, benefits and alternatives for the proposed anesthesia with the patient or authorized representative who has indicated his/her understanding and acceptance.   Dental advisory given  Plan Discussed with: CRNA and Surgeon  Anesthesia Plan Comments: (Obtained a Cardiology consult with Dr Forest Gleason. Pt had ECHO in his office 03/2012 with EF 40-45% and mild MR and Aortic Sclerosis. He feels that it is OK to proceed and advises IV  Cardiazem for rate control. )       Anesthesia Quick Evaluation

## 2012-06-06 NOTE — Brief Op Note (Signed)
06/03/2012 - 06/06/2012  1:19 PM  PATIENT:  Howard Pearson, 66 y.o., male, MRN: [id number]  PREOP DIAGNOSIS:  gallbladder disease  POSTOP DIAGNOSIS:   Gangrenous cholecystitis, cholelithiasis, cystic duct/hepaic duct junction low.  PROCEDURE:   Procedure(s): LAPAROSCOPIC CHOLECYSTECTOMY WITH INTRAOPERATIVE CHOLANGIOGRAM  SURGEON:   Ovidio Kin, M.D.  ASSISTANT:   A. Maisie Fus, M.D.  ANESTHESIA:   general  Aubery Lapping, MD - Anesthesiologist Luster Landsberg, CRNA - CRNA Germaine Pomfret, MD - Anesthesiologist Nicholos Johns, CRNA - CRNA Luster Landsberg, CRNA - CRNA  General  EBL:  <100  ml  BLOOD ADMINISTERED: none  DRAINS: 19 F  LOCAL MEDICATIONS USED:   30 cc 1/4% marcaine  SPECIMEN:   Gall bladder  COUNTS CORRECT:  YES  INDICATIONS FOR PROCEDURE:  Howard Pearson is a 66 y.o. (DOB: 1946/04/22) white male whose primary care physician is No primary provider on file. and comes for cholecystectomy with IOC.   The indications and risks of the surgery were explained to the patient.  The risks include, but are not limited to, infection, bleeding, and nerve injury.  Note dictated to:   #[id number]

## 2012-06-06 NOTE — Progress Notes (Signed)
Amiodarone Drug - Drug Interaction Consult Note   Recommendations: No significant DDI with current therapies except Coumadin.  Pharmacy dosing Coumadin and will monitor closely.   Amiodarone is metabolized by the cytochrome P450 system and therefore has the potential to cause many drug interactions. Amiodarone has an average plasma half-life of 50 days (range 20 to 100 days).   There is potential for drug interactions to occur several weeks or months after stopping treatment and the onset of drug interactions may be slow after initiating amiodarone.   []  Statins: Increased risk of myopathy. Simvastatin- restrict dose to 20mg  daily. Other statins: counsel patients to report any muscle pain or weakness immediately.  [x]  Anticoagulants: Amiodarone can increase anticoagulant effect. Consider warfarin dose reduction. Patients should be monitored closely and the dose of anticoagulant altered accordingly, remembering that amiodarone levels take several weeks to stabilize.  []  Antiepileptics: Amiodarone can increase plasma concentration of phenytoin, phenytoin dose should be reduced. Note that small changes in phenytoin dose can result in large changes in phenytoin levels. Monitor patient closely and counsel on signs of toxicity.  []  Beta blockers: increased risk of bradycardia, AV block and myocardial depression. Sotalol - avoid concomitant use.  []   Calcium channel blockers (diltiazem and verapamil): increased risk of bradycardia, AV block and myocardial depression.  []   Cyclosporine: Amiodarone increases levels of cyclosporine. Reduced dose of cyclosporine is recommended.  []  Digoxin dose should be halved when amiodarone is started.  []  Diuretics: increased risk of cardiotoxicity if hypokalemia occurs.  []  Oral hypoglycemic agents (glyburide, glipizide, glimepiride): increased risk of hypoglycemia. Patient's glucose levels should be monitored closely when initiating amiodarone therapy.    []  Drugs that prolong the QT interval: Concurrent therapy is contraindicated due to the increased risk of torsades de pointes; . Antibiotics: e.g. fluoroquinolones, erythromycin. . Antiarrhythmics: e.g. quinidine, procainamide, disopyramide, sotalol. . Antipsychotics: e.g. phenothiazines, haloperidol.  . Lithium, tricyclic antidepressants, and methadone.   Thank You,  Chelsea Aus D. Laney Potash, PharmD, BCPS Pager:  [phone] 06/06/2012, 8:55 AM

## 2012-06-06 NOTE — Progress Notes (Signed)
Pt was put on ventilator by RT at 1405 on 06/06/2012. Vitals were stable HR 100, Sat 95%, RR 19. Ventilator settings PS/CPAP, 50% oxygen, 5 of PEEP, 10 Pressure Support. Tube was held by pink tap and tube was at 23 at the lips. BS were clear bilaterally

## 2012-06-06 NOTE — Progress Notes (Signed)
Pt transferred to stretcher after walked to bathroom to void. amio gtt infusing. To OR via stretcher with family. Howard Pearson

## 2012-06-06 NOTE — Transfer of Care (Signed)
Immediate Anesthesia Transfer of Care Note  Patient: Howard Pearson  Procedure(s) Performed: Procedure(s) (LRB): LAPAROSCOPIC CHOLECYSTECTOMY WITH INTRAOPERATIVE CHOLANGIOGRAM (N/A)  Patient Location: PACU  Anesthesia Type: General  Level of Consciousness: sedated and patient cooperative  Airway & Oxygen Therapy: Patient remains intubated per anesthesia plan and Patient placed on Ventilator (see vital sign flow sheet for setting)  Post-op Assessment: Report given to PACU RN and Post -op Vital signs reviewed and stable  Post vital signs: Reviewed and stable  Complications: Pt remains intubated in PACU - will reassess muscle strength

## 2012-06-07 DIAGNOSIS — B9689 Other specified bacterial agents as the cause of diseases classified elsewhere: Secondary | ICD-10-CM

## 2012-06-07 DIAGNOSIS — K810 Acute cholecystitis: Secondary | ICD-10-CM

## 2012-06-07 DIAGNOSIS — R7881 Bacteremia: Secondary | ICD-10-CM

## 2012-06-07 LAB — BASIC METABOLIC PANEL
BUN: 18 mg/dL (ref 6–23)
Calcium: 8.1 mg/dL — ABNORMAL LOW (ref 8.4–10.5)
Creatinine, Ser: 0.83 mg/dL (ref 0.50–1.35)
GFR calc Af Amer: >90 mL/min (ref >90)
GFR calc non Af Amer: 88 mL/min — ABNORMAL LOW (ref >90)

## 2012-06-07 LAB — PROTIME-INR: Prothrombin Time: 17.5 seconds — ABNORMAL HIGH (ref 11.6–15.2)

## 2012-06-07 LAB — PREPARE FRESH FROZEN PLASMA

## 2012-06-07 LAB — CULTURE, BLOOD (ROUTINE X 2)

## 2012-06-07 MED ORDER — AMIODARONE HCL 200 MG PO TABS
200.0000 mg | ORAL_TABLET | Freq: Two times a day (BID) | ORAL | Status: DC
  Administered 2012-06-07 – 2012-06-10 (×7): 200 mg via ORAL
  Filled 2012-06-07 (×8): qty 1

## 2012-06-07 MED ORDER — FLEET ENEMA 7-19 GM/118ML RE ENEM
1.0000 | ENEMA | Freq: Every day | RECTAL | Status: DC | PRN
  Filled 2012-06-07: qty 1

## 2012-06-07 MED ORDER — DILTIAZEM HCL 60 MG PO TABS
60.0000 mg | ORAL_TABLET | Freq: Three times a day (TID) | ORAL | Status: DC
  Filled 2012-06-07 (×3): qty 1

## 2012-06-07 MED ORDER — DOCUSATE SODIUM 100 MG PO CAPS
100.0000 mg | ORAL_CAPSULE | Freq: Two times a day (BID) | ORAL | Status: DC
  Administered 2012-06-07 – 2012-06-10 (×7): 100 mg via ORAL
  Filled 2012-06-07 (×8): qty 1

## 2012-06-07 MED ORDER — MAGNESIUM HYDROXIDE 400 MG/5ML PO SUSP
15.0000 mL | Freq: Every day | ORAL | Status: DC | PRN
  Administered 2012-06-09: 15 mL via ORAL
  Filled 2012-06-07: qty 30

## 2012-06-07 MED ORDER — POLYETHYLENE GLYCOL 3350 17 G PO PACK: 17.0000 g | PACK | Freq: Every day | ORAL | Status: DC

## 2012-06-07 MED ORDER — DILTIAZEM HCL ER COATED BEADS 240 MG PO CP24
240.0000 mg | ORAL_CAPSULE | Freq: Every day | ORAL | Status: DC
  Administered 2012-06-07 – 2012-06-10 (×4): 240 mg via ORAL
  Filled 2012-06-07 (×4): qty 1

## 2012-06-07 MED ORDER — BISACODYL 10 MG RE SUPP: 10.0000 mg | Freq: Every day | RECTAL | Status: DC | PRN

## 2012-06-07 NOTE — Progress Notes (Signed)
[facility] Progress Note [place] TEAM 1 - Stepdown/ICU TEAM   STANLY SI ZOX:[id number] DOB: 05-30-1946 DOA: 06/03/2012 PCP: No primary provider on file.  Brief narrative: 66 year old male patient with chronic atrial fibrillation on Coumadin as well as hypertension. Was in usual state of health when he awakened at 2 AM on the morning of 06/02/2012 with low back pain. He had some leftover Vicodin which he took and this improved his back pain. He had no urinary symptoms such as frequency or dysuria. Throughout the remainder of the day he apparently felt okay but at 4 AM 06/03/2012 he again woke up this time with shaking chills, fever and he felt sweaty. He went back to sleep but when he awakened later in the morning he felt very weak and tired. Later in the morning he had further chills and was unable to eat. He sat down in a chair and wasn't as active as usual. His wife he came concerned about his overall status so she called 911. The patient was transported to [facility] emergency department where his systolic blood pressure was found to be relatively low in the 90s and the patient was tachycardic with a heart rate of 117. Physical exam was otherwise unremarkable. The patient had a mild leukocytosis of 16,000 but his hemoglobin seemed to be elevated and hemoconcentrated at 16.8. His lactic acid was also elevated at 3.5 and his urinalysis appeared consistent with an acute urinary tract infection. Chest x-ray was negative for any source of infection or any volume overload. Because he had a pattern of hypertension that responded to IV fluids with a likely source of infection from the urinary tract it was felt the patient had systemic inflammatory response syndrome and was admitted to the step down unit for further monitoring and evaluation. Pulmonary critical care medicine was consulted and did not feel that the patient met criteria for septic shock protocol.  Assessment/Plan:  SIRS (systemic  inflammatory response syndrome)/Leukocytosis/ Fever *Continue supportive care - source is now known to be gangrenous cholecystitis  Cholelithiasis with acute on chronic cholecystitis status post laparoscopic cholecystectomy 06/06/2012 *Ultrasound revealed large gallstone as well as thickened gallbladder wall consistent with chronic cholecystitis and CT scan revealed hepatic abscess *Obstructive transaminitis which has not improved is likely reflective of acute cholecystitis *As per general surgery no abscess seen during operation *Acute hepatitis panel negative *Plain abdominal x-ray showed no evidence of obstruction or ileus *Surgery advancing diet  ? UTI (lower urinary tract infection) *Urine culture negative   Thrombocytopenia *Likely secondary to gram-negative infection *Continue to follow CBC  Klebsiella gram-negative bacteremia/diphtheroid Gram-positive bacteremia * Klebsiella is pan sensitive-MIC for Primaxin as well as Cipro is less than 0.25 so once proves tolerating solid diet for greater than 24 hours can transition from Primaxin to Cipro orally *Suspected source is gallbladder  *Likely will need a total of 2 weeks duration antibiotics. Surgery also recommends same duration antibiotic therapy to treat gangrenous cholecystitis  Atrial fibrillation with uncontrolled ventricular response *Continues with some tachycardia but not as significant as preoperatively *Home medications clarified as Cardizem CD and Pacerone *Today will transition from IV amiodarone to oral amiodarone and begin Cardizem since blood pressure is *Appreciate cardiology consultation preoperatively for cardiac clearance and management of atrial fibrillation *Atrial fibrillation was new diagnosis for this patient only a few weeks prior to admission and he will eventually undergo elective cardioversion procedure as an outpatient as well as outpatient stress test  Hypotension/  Dehydration *Resolved *Multi-factorial related to  gram-negative infection as well as volume depletion  *This morning patient was slightly cachectic but lungs were clear so we'll going to then decrease IV fluids to 50 cc per hour and if proves can tolerate diet can normal saline lock IV fluid  Chronic warfarin anticoagulation with Mild coagulopathy *Patient on chronic Coumadin and INR had increased slightly since admission and likely was due to acute infection as well as antibiotic usage *Spoke with Dr. Ezzard Standing on 06/07/2012 and recommends resuming Coumadin on 06/08/2012 but no heparin bolus or bridge. *Management per pharmacy *Reversal of anticoagulation initiated on 06/06/2012 with subcutaneous vitamin K as well as 2 units of FFP. Surgery also ordered additional FFP on morning immediately preceding surgery.  BPH (benign prostatic hyperplasia)  Lumbar disc herniation *Seen on MRI spine this admission-no infection/abscess or osteomyelitis seen *Patient denies lower extremity weakness, numbness, tingling or pain second of for additional workup to primary care physician  Gout   DVT prophylaxis: Chronically anticoagulated on Coumadin with therapeutic INR at presentation. Coumadin reversed for surgery 06/06/2012. Postoperative DVT prophylaxis at discretion of surgery team Code Status: Full Disposition Plan: Transfer to medical floor  Consultants: None  Procedures: None  Antibiotics: Vancomycin IV 8/4 >>>8/5 Primaxin IV 8/4 >>> Ciprofloxacin IV 8/4 >>>8/5  HPI/Subjective: Much more alert and states feels better since surgery. Tolerating some clear liquids. Complains of expected operative site pain. Less fatigue.   Objective: Blood pressure 132/96, pulse 97, temperature 99 F (37.2 C), temperature source Oral, resp. rate 14, height 5\' 11"  (1.803 m), weight 101.4 kg (223 lb 8.7 oz), SpO2 91.00%.  Intake/Output Summary (Last 24 hours) at 06/07/12 1351 Last data filed at 06/07/12  1300  Gross per 24 hour  Intake 3773.83 ml  Output  695.5 ml  Net 3078.33 ml     Exam: General: No acute respiratory distress, appears pale Lungs: Clear to auscultation bilaterally without wheezes or crackles, room air Cardiovascular: Irregular rate and rhythm without murmur gallop or rub normal S1 and S2, atrial fibrillation with ventricular rates low 100, IV amiodarone infusing Abdomen: Nontender, slightly distended, soft, bowel sounds positive, no rebound, no ascites, no appreciable mass, right upper quadrant drain, surgical dressings dry Musculoskeletal: No significant cyanosis, clubbing of extremities Neurological: Patient alert and oriented x3, moves all extremities x4, exam nonfocal  Data Reviewed: Basic Metabolic Panel:  Lab 06/07/12 4098 06/06/12 0340 06/05/12 0207 06/05/12 0152 06/04/12 0450 06/03/12 1135  NA 137 139 -- 137 138 141  K 3.6 3.5 -- 3.2* 3.6 3.8  CL 107 105 -- 104 106 103  CO2 19 20 -- 19 22 23   GLUCOSE 197* 97 -- 121* 122* 151*  BUN 18 14 -- 18 22 16   CREATININE 0.83 0.78 -- 0.90 1.18 1.30  CALCIUM 8.1* 8.7 -- 8.3* 8.1* 8.9  MG -- 2.0 1.7 -- -- --  PHOS -- -- 2.2* -- -- --   Liver Function Tests:  Lab 06/06/12 1739 06/06/12 0340 06/05/12 0152 06/04/12 0450  AST 59* 50* 71* 82*  ALT 48 52 71* 69*  ALKPHOS 200* 210* 134* 89  BILITOT 4.0* 4.1* 2.5* 2.2*  PROT 5.7* 6.2 6.1 5.4*  ALBUMIN 2.3* 2.7* 2.8* 2.6*   CBC:  Lab 06/06/12 0750 06/05/12 0152 06/04/12 0450 06/03/12 1135  WBC 15.0* 11.6* 17.6* 16.1*  NEUTROABS 12.3* 10.6* -- 15.3*  HGB 13.7 15.2 14.6 16.8  HCT 40.1 44.6 44.3 49.2  MCV 83.4 83.8 85.5 85.3  PLT 108* 99* 110* 157   Cardiac Enzymes:  Lab 06/03/12  1135  CKTOTAL 123  CKMB 2.0  CKMBINDEX --  TROPONINI <0.30    Recent Results (from the past 240 hour(s))  URINE CULTURE     Status: Normal   Collection Time   06/03/12  2:53 PM      Component Value Range Status Comment   Specimen Description URINE, CLEAN CATCH   Final     Special Requests NONE   Final    Culture  Setup Time 06/03/2012 20:23   Final    Colony Count NO GROWTH   Final    Culture NO GROWTH   Final    Report Status 06/05/2012 FINAL   Final   CULTURE, BLOOD (ROUTINE X 2)     Status: Normal   Collection Time   06/03/12  3:20 PM      Component Value Range Status Comment   Specimen Description BLOOD LEFT ARM   Final    Special Requests BOTTLES DRAWN AEROBIC AND ANAEROBIC   Final    Culture  Setup Time 06/03/2012 20:23   Final    Culture     Final    Value: KLEBSIELLA PNEUMONIAE     Note: Gram Stain Report Called to,Read Back By and Verified With: [facility] MILLS @ 1032 06/04/12 BY KRAWS   Report Status 06/06/2012 FINAL   Final    Organism ID, Bacteria KLEBSIELLA PNEUMONIAE   Final   CULTURE, BLOOD (ROUTINE X 2)     Status: Normal   Collection Time   06/03/12  8:03 PM      Component Value Range Status Comment   Specimen Description BLOOD RIGHT HAND   Final    Special Requests BOTTLES DRAWN AEROBIC AND ANAEROBIC 10CC EA   Final    Culture  Setup Time 06/04/2012 02:52   Final    Culture     Final    Value: DIPHTHEROIDS(CORYNEBACTERIUM SPECIES)     Note: Standardized susceptibility testing for this organism is not available.     Note: Gram Stain Report Called to,Read Back By and Verified With: TIM IRBY 06/05/2012 6:29AM YIMSU   Report Status 06/07/2012 FINAL   Final   CULTURE, BLOOD (ROUTINE X 2)     Status: Normal (Preliminary result)   Collection Time   06/03/12  8:03 PM      Component Value Range Status Comment   Specimen Description BLOOD LEFT HAND   Final    Special Requests BOTTLES DRAWN AEROBIC ONLY 5CC   Final    Culture  Setup Time 06/04/2012 02:52   Final    Culture     Final    Value:        BLOOD CULTURE RECEIVED NO GROWTH TO DATE CULTURE WILL BE HELD FOR 5 DAYS BEFORE ISSUING A FINAL NEGATIVE REPORT   Report Status PENDING   Incomplete   MRSA PCR SCREENING     Status: Normal   Collection Time   06/03/12  9:00 PM      Component Value  Range Status Comment   MRSA by PCR NEGATIVE  NEGATIVE Final      Studies:  Recent x-ray studies have been reviewed in detail by the Attending Physician  Scheduled Meds:  Reviewed in detail by the Attending Physician   Junious Silk, ANP [facility] Office  [phone] Pager [phone]  On-Call/Text Page:      [email or web address]      password [email or web address]  If 7PM-7AM, please contact night-coverage [email or web address] Password [email or web address] 06/07/2012, 1:51 PM   LOS: 4  days   ADDENDUM:  I have reviewed clinical data in detail, personally examined patient and concur with PA's assessment and plan.   C. Shamya Macfadden. MD, FACP.

## 2012-06-07 NOTE — Progress Notes (Signed)
Patient Name: Howard Pearson Date of Encounter: 06/07/2012    SUBJECTIVE:The patient denies cardiac complaints. He is feeling better.  TELEMETRY:  A fib with RVR: Filed Vitals:   06/06/12 2007 06/06/12 2310 06/06/12 2311 06/07/12 0350  BP:  124/96  141/97  Pulse:  81  97  Temp: 98.1 F (36.7 C)  100.2 F (37.9 C) 99.3 F (37.4 C)  TempSrc: Oral  Oral Oral  Resp:  17  14  Height:      Weight:      SpO2:  93%  91%    Intake/Output Summary (Last 24 hours) at 06/07/12 0826 Last data filed at 06/07/12 0400  Gross per 24 hour  Intake 5283.83 ml  Output    463 ml  Net 4820.83 ml    LABS: Basic Metabolic Panel:  Basename 06/07/12 0542 06/06/12 0340 06/05/12 0207  NA 137 139 --  K 3.6 3.5 --  CL 107 105 --  CO2 19 20 --  GLUCOSE 197* 97 --  BUN 18 14 --  CREATININE 0.83 0.78 --  CALCIUM 8.1* 8.7 --  MG -- 2.0 1.7  PHOS -- -- 2.2*   CBC:  Basename 06/06/12 0750 06/05/12 0152  WBC 15.0* 11.6*  NEUTROABS 12.3* 10.6*  HGB 13.7 15.2  HCT 40.1 44.6  MCV 83.4 83.8  PLT 108* 99*  Radiology/Studies:  N/A  Physical Exam: Blood pressure 141/97, pulse 97, temperature 99.3 F (37.4 C), temperature source Oral, resp. rate 14, height 5\' 11"  (1.803 m), weight 101.4 kg (223 lb 8.7 oz), SpO2 91.00%. Weight change:    Irregularly irregular rhythm with increased HR. No rales  ASSESSMENT:  1. Atrial fib with poor rate control.  2. Asymptomatic chronic diastolic heart failure.  3. Anticoagulation  Plan:  1. Resume oral amio once able to take PO therapy 2. Continue diltiazem and titrate for rate control.  3. Resume coumadin when safe  Signed, Lesleigh Noe 06/07/2012, 8:26 AM

## 2012-06-07 NOTE — Progress Notes (Signed)
General Surgery Note  LOS: 4 days  POD# 1  Assessment/Plan: 1.  LAPAROSCOPIC CHOLECYSTECTOMY WITH INTRAOPERATIVE CHOLANGIOGRAM - Howard Pearson - 06/06/2012  Looks okay.  On clear liquids.  Can be advanced as tolerated.  2. Liver abscess adjacent to GB - seen on CT scan - drain in gall bladder bed - should be on antibiotics at least 14 days from date of surgery 3. SIRS (systemic inflammatory response syndrome) infection)  On Primaxin 4. Atrial fibrillation with rapid ventricular rate - he is running in the 110's to 120's   Seen by Dr. Miki Kins to restart oral amio   5. Thrombocytopenia   Platelets 108,000 - 06/06/2012   Note: admission platelets were 157,000 - 06/03/2012   6. Anti-coagulation   PT/INR - 17.5/1.41 - 06/07/2012  Can probably restart coumadin tomorrow - 06/08/2012 (do not bolus)  7. Gram-negative bacteremia - Klebsiella -  and gram positive - Diphtheroids 8. Gout  9. BPH (benign prostatic hyperplasia)  10. Lumbar disc herniation  11. History of hypertension  Subjective:  Feels better.  Taking clear liquids without difficulty. Objective:   Filed Vitals:   06/07/12 0350  BP: 141/97  Pulse: 97  Temp: 99.3 F (37.4 C)  Resp: 14     Intake/Output from previous day:  08/07 0701 - 08/08 0700 In: 5546.3 [P.O.:880; I.V.:4153.8; Blood:312.5; IV Piggyback:200] Out: 663 [Urine:550; Drains:63; Blood:50]  Intake/Output this shift:      Physical Exam:   General: Obese WN WM who is alert and oriented.    HEENT: Normal. Pupils equal. .   Lungs: Clear   Abdomen: Soft, drain in RUQ with 10 cc last shift.   Wound: Okay   Psychiatric: Has normal mood and affect.    Lab Results:    Basename 06/06/12 0750 06/05/12 0152  WBC 15.0* 11.6*  HGB 13.7 15.2  HCT 40.1 44.6  PLT 108* 99*    BMET   Basename 06/07/12 0542 06/06/12 0340  NA 137 139  K 3.6 3.5  CL 107 105  CO2 19 20  GLUCOSE 197* 97  BUN 18 14  CREATININE 0.83 0.78  CALCIUM 8.1* 8.7    PT/INR   Basename  06/07/12 0542 06/06/12 0340  LABPROT 17.5* 18.4*  INR 1.41 1.50*    ABG  No results found for this basename: PHART:2,PCO2:2,PO2:2,HCO3:2 in the last 72 hours   Studies/Results:  Dg Cholangiogram Operative  06/06/2012  *RADIOLOGY REPORT*  Clinical Data:   Acute cholecystitis  INTRAOPERATIVE CHOLANGIOGRAM  Technique:  Cholangiographic images from the C-arm fluoroscopic device were submitted for interpretation post-operatively.  Please see the procedural report for the amount of contrast and the fluoroscopy time utilized.  Comparison:  CT abdomen pelvis of 06/05/2012  Findings:  Contrast was injected via the cystic duct.  There is good filling of the cystic duct and common bile duct with free passage of contrast into the duodenal loop.  No filling defect or obstruction is seen.  IMPRESSION: Negative intraoperative cholangiogram.  Original Report Authenticated By: Juline Patch, M.D.   US Abdomen Complete  06/05/2012  *RADIOLOGY REPORT*  Clinical Data:  Increased LFTs, question cholecystitis  COMPLETE ABDOMINAL ULTRASOUND  Comparison:  Renal ultrasound 06/03/2012  Findings:  Gallbladder:  There is a gallstone in gallbladder neck region measures 2.2 cm.  Thickening of gallbladder wall up to 6.4 mm thickness.  Echogenic foci of gallbladder wall probable represents cholesterol polyps.  There is no sonographic Murphy's sign.  Common bile duct:  Measures 6.3 mm  in diameter.  Liver:  No focal lesion identified.  Within normal limits in parenchymal echogenicity.  IVC:  Appears normal.  Pancreas:  Limited assessment due to abundant bowel gas  Spleen:  Measures measures 9 cm in length.  Accessory splenule is noted measures 1.2 x 1 cm.  Right Kidney:  Measures 11.6 cm in length.  No hydronephrosis or diagnostic renal calculus  Left Kidney:  Measures 11.5 cm in length.  No hydronephrosis. Echogenic foci within parenchyma may represent nonobstructive calcifications of vascular calcifications.  The largest measures 6 mm.   Abdominal aorta:  No aneurysm identified. Measures up to 2.9 cm in diameter.  Small bilateral pleural effusion.  Trace abdominal ascites.  IMPRESSION:  1.  There is a gallstone in gallbladder neck region measures 2.4 cm.  Thickening of gallbladder wall up to 6.4 mm.  Although there is no sonographic Murphy's sign clinical correlation is necessary to exclude early cholecystitis.  Echogenic foci of gallbladder wall may represent cholesterol polyps. 2.  Normal CBD. 3.  No hydronephrosis. 4.  Echogenic foci within the left renal parenchyma may represent nonobstructive calculi or vascular calcifications.  Original Report Authenticated By: Natasha Mead, M.D.   Ct Abdomen Pelvis W Contrast  06/05/2012  **ADDENDUM** CREATED: 06/05/2012 18:25:06  Critical Value/emergent results were called by telephone at the time of interpretation on 06/05/2012 at 6:25 p.m. to Dr. Luisa Hart, who verbally acknowledged these results.  **END ADDENDUM** SIGNED BY: Allayne Gitelman. Jena Gauss, M.D.   06/05/2012  *RADIOLOGY REPORT*  Clinical Data: Fever, leukocytosis, and elevated liver enzymes. Cholelithiasis.  CT ABDOMEN AND PELVIS WITH CONTRAST  Technique:  Multidetector CT imaging of the abdomen and pelvis was performed following the standard protocol during bolus administration of intravenous contrast.  Contrast: OMNIPAQUE IOHEXOL 300 MG/ML  SOLN  Comparison: Ultrasound dated 06/05/2012  Findings: There is an area of abnormal low density with peripheral slight enhancement in the left lobe of the liver immediately above the gallbladder.  This measures 3.0 x 1.7 by 1.7 cm and has consistent with a hepatic abscess.  The gallbladder is distended with a 2 cm stone in the neck of the gallbladder with diffuse thickening and edema of the gallbladder wall.  There are no dilated bile ducts.  There is minimal atelectasis at the lung bases with tiny effusions.  The spleen, pancreas, adrenal glands, and kidneys with straight no acute abnormality.  There are  several small stones in the lower pole of the right kidney.  There is a small amount of ascites in the paracolic gutters.  There are no dilated loops of large or small bowel.  Terminal ileum and appendix are normal.  No diverticular disease.  No significant adenopathy.  IMPRESSION: Probable acute cholecystitis with an adjacent small abscess in the left lobe of the liver.  Small amount of ascites.  Original Report Authenticated By: Gwynn Burly, M.D.   Dg Abd Portable 1v  06/05/2012  *RADIOLOGY REPORT*  Clinical Data: Deflated the liver function test.  Cholelithiasis.  PORTABLE ABDOMEN - 1 VIEW  Comparison: Ultrasound dated 06/05/2012  Findings: There are no dilated loops of large or small bowel.  The gallstone visible on ultrasound exam is not appreciable on this abdominal radiograph.  No visible free air or free fluid on this supine radiograph.  No acute osseous abnormality.  IMPRESSION: Benign-appearing abdomen.  Original Report Authenticated By: Gwynn Burly, M.D.     Anti-infectives:   Anti-infectives     Start     Dose/Rate Route  Frequency Ordered Stop   06/04/12 2000   imipenem-cilastatin (PRIMAXIN) 500 mg in sodium chloride 0.9 % 100 mL IVPB  Status:  Discontinued        500 mg 200 mL/hr over 30 Minutes Intravenous 4 times per day 06/04/12 1437 06/04/12 1440   06/04/12 1500  imipenem-cilastatin (PRIMAXIN) 500 mg in sodium chloride 0.9 % 100 mL IVPB       500 mg 200 mL/hr over 30 Minutes Intravenous 4 times per day 06/04/12 1440     06/03/12 2200   imipenem-cilastatin (PRIMAXIN) 500 mg in sodium chloride 0.9 % 100 mL IVPB  Status:  Discontinued        500 mg 200 mL/hr over 30 Minutes Intravenous 3 times per day 06/03/12 1810 06/04/12 1437   06/03/12 2000   vancomycin (VANCOCIN) IVPB 1000 mg/200 mL premix  Status:  Discontinued        1,000 mg 200 mL/hr over 60 Minutes Intravenous Every 12 hours 06/03/12 1810 06/04/12 1612   06/03/12 1600   ciprofloxacin (CIPRO) IVPB 400 mg         400 mg 200 mL/hr over 60 Minutes Intravenous  Once 06/03/12 1548 06/03/12 1719          Ovidio Kin, MD, FACS Pager: [phone],   [facility] [place] [facility] Office: [phone] 06/07/2012

## 2012-06-07 NOTE — Progress Notes (Signed)
ANTIBIOTIC CONSULT NOTE - FOLLOW UP  Pharmacy Consult for Primaxin  Indication: liver abscess, empiric sepsis  Allergies  Allergen Reactions  . Penicillins     unknown    Patient Measurements: Height: 5\' 11"  (180.3 cm) Weight: 223 lb 8.7 oz (101.4 kg) IBW/kg (Calculated) : 75.3   Vital Signs: Temp: 98.7 F (37.1 C) (08/08 0800) Temp src: Oral (08/08 0800) BP: 138/84 mmHg (08/08 1055) Pulse Rate: 97  (08/08 0350) Intake/Output from previous day: 08/07 0701 - 08/08 0700 In: 5546.3 [P.O.:880; I.V.:4153.8; Blood:312.5; IV Piggyback:200] Out: 663 [Urine:550; Drains:63; Blood:50] Intake/Output from this shift: Total I/O In: 120 [P.O.:120] Out: -   Labs:  Basename 06/07/12 0542 06/06/12 0750 06/06/12 0340 06/05/12 0152  WBC -- 15.0* -- 11.6*  HGB -- 13.7 -- 15.2  PLT -- 108* -- 99*  LABCREA -- -- -- --  CREATININE 0.83 -- 0.78 0.90   Estimated Creatinine Clearance: 103.3 ml/min (by C-G formula based on Cr of 0.83). No results found for this basename: VANCOTROUGH:2,VANCOPEAK:2,VANCORANDOM:2,GENTTROUGH:2,GENTPEAK:2,GENTRANDOM:2,TOBRATROUGH:2,TOBRAPEAK:2,TOBRARND:2,AMIKACINPEAK:2,AMIKACINTROU:2,AMIKACIN:2, in the last 72 hours   Microbiology: Recent Results (from the past 720 hour(s))  URINE CULTURE     Status: Normal   Collection Time   06/03/12  2:53 PM      Component Value Range Status Comment   Specimen Description URINE, CLEAN CATCH   Final    Special Requests NONE   Final    Culture  Setup Time 06/03/2012 20:23   Final    Colony Count NO GROWTH   Final    Culture NO GROWTH   Final    Report Status 06/05/2012 FINAL   Final   CULTURE, BLOOD (ROUTINE X 2)     Status: Normal   Collection Time   06/03/12  3:20 PM      Component Value Range Status Comment   Specimen Description BLOOD LEFT ARM   Final    Special Requests BOTTLES DRAWN AEROBIC AND ANAEROBIC   Final    Culture  Setup Time 06/03/2012 20:23   Final    Culture     Final    Value: KLEBSIELLA PNEUMONIAE       Note: Gram Stain Report Called to,Read Back By and Verified With: [facility] MILLS @ 1032 06/04/12 BY KRAWS   Report Status 06/06/2012 FINAL   Final    Organism ID, Bacteria KLEBSIELLA PNEUMONIAE   Final   CULTURE, BLOOD (ROUTINE X 2)     Status: Normal   Collection Time   06/03/12  8:03 PM      Component Value Range Status Comment   Specimen Description BLOOD RIGHT HAND   Final    Special Requests BOTTLES DRAWN AEROBIC AND ANAEROBIC 10CC EA   Final    Culture  Setup Time 06/04/2012 02:52   Final    Culture     Final    Value: DIPHTHEROIDS(CORYNEBACTERIUM SPECIES)     Note: Standardized susceptibility testing for this organism is not available.     Note: Gram Stain Report Called to,Read Back By and Verified With: TIM IRBY 06/05/2012 6:29AM YIMSU   Report Status 06/07/2012 FINAL   Final   CULTURE, BLOOD (ROUTINE X 2)     Status: Normal (Preliminary result)   Collection Time   06/03/12  8:03 PM      Component Value Range Status Comment   Specimen Description BLOOD LEFT HAND   Final    Special Requests BOTTLES DRAWN AEROBIC ONLY 5CC   Final    Culture  Setup Time  06/04/2012 02:52   Final    Culture     Final    Value:        BLOOD CULTURE RECEIVED NO GROWTH TO DATE CULTURE WILL BE HELD FOR 5 DAYS BEFORE ISSUING A FINAL NEGATIVE REPORT   Report Status PENDING   Incomplete   MRSA PCR SCREENING     Status: Normal   Collection Time   06/03/12  9:00 PM      Component Value Range Status Comment   MRSA by PCR NEGATIVE  NEGATIVE Final     Anti-infectives     Start     Dose/Rate Route Frequency Ordered Stop   06/04/12 2000   imipenem-cilastatin (PRIMAXIN) 500 mg in sodium chloride 0.9 % 100 mL IVPB  Status:  Discontinued        500 mg 200 mL/hr over 30 Minutes Intravenous 4 times per day 06/04/12 1437 06/04/12 1440   06/04/12 1500   imipenem-cilastatin (PRIMAXIN) 500 mg in sodium chloride 0.9 % 100 mL IVPB        500 mg 200 mL/hr over 30 Minutes Intravenous 4 times per day 06/04/12 1440      06/03/12 2200   imipenem-cilastatin (PRIMAXIN) 500 mg in sodium chloride 0.9 % 100 mL IVPB  Status:  Discontinued        500 mg 200 mL/hr over 30 Minutes Intravenous 3 times per day 06/03/12 1810 06/04/12 1437   06/03/12 2000   vancomycin (VANCOCIN) IVPB 1000 mg/200 mL premix  Status:  Discontinued        1,000 mg 200 mL/hr over 60 Minutes Intravenous Every 12 hours 06/03/12 1810 06/04/12 1612   06/03/12 1600   ciprofloxacin (CIPRO) IVPB 400 mg        400 mg 200 mL/hr over 60 Minutes Intravenous  Once 06/03/12 1548 06/03/12 1719          Assessment: 66 yo M POD1 laparoscopic cholecystectomy with intraoperative cholangiogram.  Noted to have a liver abscess on CT, and will need antibiotics for 14 days since surgery per surgeon.  Presented with SIRS and empirically placed on Primaxin and vancomycin on 8/4.  Vancomycin was discontinued 8/5, and has been continued on Primaxin.  Patient Tmax 100.2, WBC trending down yesterday, and urine function stable (although minimal UOP noted).  8/4 Blood cultures with klebsiella (sensitive to all except ampicillin) and corynebacterium.  Goal of Therapy:  resolution of infection  Plan:  1.  Continue Primaxin 500 mg IV q6hr  2.  F/up de-escalation of antibiotics, renal function  Rolland Porter, Pharm.D., BCPS Clinical Pharmacist Pager: [phone] 06/07/2012,11:22 AM

## 2012-06-07 NOTE — Progress Notes (Signed)
Inpatient Diabetes Program Recommendations  AACE/ADA: New Consensus Statement on Inpatient Glycemic Control (2013)  Target Ranges:  Prepandial:   less than 140 mg/dL      Peak postprandial:   less than 180 mg/dL (1-2 hours)      Critically ill patients:  140 - 180 mg/dL   Reason for Visit: Lab glucose of 197 mg/dl today  Inpatient Diabetes Program Recommendations HgbA1C: Request MD order for Hgb A1C  Results for Howard Pearson, Howard Pearson (MRN [id number]) as of 06/07/2012 12:45  Ref. Range 06/03/2012 11:35 06/04/2012 04:50 06/05/2012 01:52 06/06/2012 03:40 06/07/2012 05:42  Glucose Latest Range: 70-99 mg/dL 010 (H) 272 (H) 536 (H) 97 197 (H)   Note: No history of diabetes noted.  Note that another lab glucose will be drawn tomorrow.  May also consider ordering CBG's.  Thank you.  Rawn Quiroa S. Elsie Lincoln, RN, CNS, CDE  [phone])

## 2012-06-07 NOTE — Progress Notes (Signed)
Utilization review completed.  

## 2012-06-08 ENCOUNTER — Encounter (HOSPITAL_COMMUNITY): Payer: Self-pay | Admitting: Surgery

## 2012-06-08 DIAGNOSIS — D72829 Elevated white blood cell count, unspecified: Secondary | ICD-10-CM

## 2012-06-08 LAB — CBC
Hemoglobin: 13.1 g/dL (ref 13.0–17.0)
MCH: 27.6 pg (ref 26.0–34.0)
RBC: 4.74 MIL/uL (ref 4.22–5.81)
WBC: 13.5 10*3/uL — ABNORMAL HIGH (ref 4.0–10.5)

## 2012-06-08 LAB — COMPREHENSIVE METABOLIC PANEL
ALT: 35 U/L (ref 0–53)
Alkaline Phosphatase: 198 U/L — ABNORMAL HIGH (ref 39–117)
CO2: 23 mEq/L (ref 19–32)
Chloride: 106 mEq/L (ref 96–112)
GFR calc Af Amer: >90 mL/min (ref >90)
GFR calc non Af Amer: 88 mL/min — ABNORMAL LOW (ref >90)
Glucose, Bld: 96 mg/dL (ref 70–99)
Potassium: 3.1 mEq/L — ABNORMAL LOW (ref 3.5–5.1)
Sodium: 139 mEq/L (ref 135–145)

## 2012-06-08 LAB — PROTIME-INR
INR: 1.16 (ref 0.00–1.49)
Prothrombin Time: 15 seconds (ref 11.6–15.2)

## 2012-06-08 MED ORDER — WARFARIN SODIUM 5 MG PO TABS
5.0000 mg | ORAL_TABLET | Freq: Once | ORAL | Status: DC
  Filled 2012-06-08: qty 1

## 2012-06-08 MED ORDER — WARFARIN SODIUM 4 MG PO TABS
4.0000 mg | ORAL_TABLET | Freq: Once | ORAL | Status: AC
  Administered 2012-06-08: 4 mg via ORAL
  Filled 2012-06-08 (×2): qty 1

## 2012-06-08 MED ORDER — WARFARIN - PHARMACIST DOSING INPATIENT
Freq: Every day | Status: DC
  Administered 2012-06-08: 21:00:00

## 2012-06-08 MED ORDER — POTASSIUM CHLORIDE CRYS ER 20 MEQ PO TBCR
40.0000 meq | EXTENDED_RELEASE_TABLET | Freq: Two times a day (BID) | ORAL | Status: AC
  Administered 2012-06-08 (×2): 40 meq via ORAL
  Filled 2012-06-08 (×2): qty 2

## 2012-06-08 NOTE — Progress Notes (Signed)
Clinical Social Work Department BRIEF PSYCHOSOCIAL ASSESSMENT 06/08/2012  Patient:  Howard Pearson, Howard Pearson     Account Number:  [id number]     Admit date:  06/03/2012  Clinical Social Worker:  Dennison Bulla  Date/Time:  06/08/2012 03:45 PM  Referred by:  Physician  Date Referred:  06/08/2012 Referred for  SNF Placement   Other Referral:   Interview type:  Patient Other interview type:   Wife present    PSYCHOSOCIAL DATA Living Status:  FAMILY Admitted from facility:   Level of care:   Primary support name:  Howard Pearson Primary support relationship to patient:  SPOUSE Degree of support available:   Strong    CURRENT CONCERNS Current Concerns  Post-Acute Placement   Other Concerns:    SOCIAL WORK ASSESSMENT / PLAN CSW received referral for possible SNF placement. CSW reviewed chart which stated PT/OT has been ordered. CSW met with patient at bedside. Wife present. Patient agreeable to wife involvement during assessment.    CSW introduced myself and explained role. CSW spoke with patient regarding living arrangements prior to admission. Patient currently lives with wife and reports that he was independent prior to admission. Patient reports that he has been up and walking in the hallway. Patient reports that he feels safe to return home and does not believe that he needs placement at this time. Patient reports that he can return home with support of wife. Wife was also agreeable to this plan. CSW will follow up after PT/OT evaluations.   Assessment/plan status:  Psychosocial Support/Ongoing Assessment of Needs Other assessment/ plan:   Information/referral to community resources:   [facility] vs SNF    PATIENT'S/FAMILY'S RESPONSE TO PLAN OF CARE: Patient was alert and oriented. Patient was engaged throughout assessment. Patient was agreeable to CSW follow up after PT/OT evaluations.

## 2012-06-08 NOTE — Progress Notes (Signed)
Pt educated on removal of JP drain, pt verbalized understanding. JP drain removed with Charge Nurse assist per MD order. Pt tolerated well.

## 2012-06-08 NOTE — Progress Notes (Signed)
Patient ID: Howard Pearson, male   DOB: [birth year], 66 y.o.   MRN: [id number] 2 Days Post-Op  Subjective: Pt still feels weak, but much better.  Tolerating clear liquids well.  Doesn't want solid food just yet, only wants to try fulls.  Ambulated yesterday with a RW.  Wife at bedside.  Objective: Vital signs in last 24 hours: Temp:  [98.3 F (36.8 C)-99 F (37.2 C)] 98.6 F (37 C) (08/09 0800) Pulse Rate:  [80-92] 86  (08/09 0417) Resp:  [17-19] 19  (08/09 0417) BP: (132-151)/(77-96) 132/82 mmHg (08/09 0417) SpO2:  [92 %] 92 % (08/09 0417) Last BM Date: 06/02/12  Intake/Output from previous day: 08/08 0701 - 08/09 0700 In: 1785 [P.O.:1085; I.V.:600; IV Piggyback:100] Out: 987.5 [Urine:975; Drains:12.5] Intake/Output this shift: Total I/O In: -  Out: 10 [Drains:10]  PE: Abd: soft, mildly tender, few BS, ND, incisions c/d/i.  JP with serous output only, very minimal, maybe 2cc. HT: irregular Lungs: CTAB  Lab Results:   Basename 06/08/12 0425 06/06/12 0750  WBC 13.5* 15.0*  HGB 13.1 13.7  HCT 39.1 40.1  PLT 146* 108*   BMET  Basename 06/08/12 0425 06/07/12 0542  NA 139 137  K 3.1* 3.6  CL 106 107  CO2 23 19  GLUCOSE 96 197*  BUN 14 18  CREATININE 0.84 0.83  CALCIUM 8.1* 8.1*   PT/INR  Basename 06/08/12 0425 06/07/12 0542  LABPROT 15.0 17.5*  INR 1.16 1.41   CMP     Component Value Date/Time   NA 139 06/08/2012 0425   K 3.1* 06/08/2012 0425   CL 106 06/08/2012 0425   CO2 23 06/08/2012 0425   GLUCOSE 96 06/08/2012 0425   BUN 14 06/08/2012 0425   CREATININE 0.84 06/08/2012 0425   CALCIUM 8.1* 06/08/2012 0425   PROT 5.6* 06/08/2012 0425   ALBUMIN 2.1* 06/08/2012 0425   AST 35 06/08/2012 0425   ALT 35 06/08/2012 0425   ALKPHOS 198* 06/08/2012 0425   BILITOT 3.1* 06/08/2012 0425   GFRNONAA 88* 06/08/2012 0425   GFRAA >90 06/08/2012 0425   Lipase  No results found for this basename: lipase    Studies/Results: Dg Cholangiogram Operative  06/06/2012  *RADIOLOGY REPORT*  Clinical  Data:   Acute cholecystitis  INTRAOPERATIVE CHOLANGIOGRAM  Technique:  Cholangiographic images from the C-arm fluoroscopic device were submitted for interpretation post-operatively.  Please see the procedural report for the amount of contrast and the fluoroscopy time utilized.  Comparison:  CT abdomen pelvis of 06/05/2012  Findings:  Contrast was injected via the cystic duct.  There is good filling of the cystic duct and common bile duct with free passage of contrast into the duodenal loop.  No filling defect or obstruction is seen.  IMPRESSION: Negative intraoperative cholangiogram.  Original Report Authenticated By: Juline Patch, M.D.    Anti-infectives: Anti-infectives     Start     Dose/Rate Route Frequency Ordered Stop   06/04/12 2000   imipenem-cilastatin (PRIMAXIN) 500 mg in sodium chloride 0.9 % 100 mL IVPB  Status:  Discontinued        500 mg 200 mL/hr over 30 Minutes Intravenous 4 times per day 06/04/12 1437 06/04/12 1440   06/04/12 1500  imipenem-cilastatin (PRIMAXIN) 500 mg in sodium chloride 0.9 % 100 mL IVPB       500 mg 200 mL/hr over 30 Minutes Intravenous 4 times per day 06/04/12 1440     06/03/12 2200   imipenem-cilastatin (PRIMAXIN) 500 mg in sodium chloride  0.9 % 100 mL IVPB  Status:  Discontinued        500 mg 200 mL/hr over 30 Minutes Intravenous 3 times per day 06/03/12 1810 06/04/12 1437   06/03/12 2000   vancomycin (VANCOCIN) IVPB 1000 mg/200 mL premix  Status:  Discontinued        1,000 mg 200 mL/hr over 60 Minutes Intravenous Every 12 hours 06/03/12 1810 06/04/12 1612   06/03/12 1600   ciprofloxacin (CIPRO) IVPB 400 mg        400 mg 200 mL/hr over 60 Minutes Intravenous  Once 06/03/12 1548 06/03/12 1719           Assessment/Plan  1. S/p lap chole for acute cholecystitis with small liver abscess - D.Odilon Cass - 06/06/2012 2. SIRS 3. A. Fib on coumadin, ok to resume today 4. Bacteremia  Plan: 1. Advance to full liquids 2. DC JP drain 3. May move out of a  SDU from surgical standpoint 4. Will get PT/OT to see him as he is now using a rolling walker and did not use one on admission. 5. Continue abx therapy.   LOS: 5 days    OSBORNE,KELLY E 06/08/2012  Agree with above. Looks good.  Will probably move out of step down today.  Wife at bedside.  Ovidio Kin, MD, [facility] Surgery Pager: [phone] Office phone:  [phone]

## 2012-06-08 NOTE — Progress Notes (Signed)
[facility] Progress Note [place] TEAM 1 - Stepdown/ICU TEAM   NGHIA MCENTEE ZOX:[id number] DOB: 1945/11/15 DOA: 06/03/2012 PCP: No primary provider on file.  Brief narrative: 66 year old male patient with chronic atrial fibrillation on Coumadin as well as hypertension. Was in usual state of health when he awakened at 2 AM on the morning of 06/02/2012 with low back pain. He had some leftover Vicodin which he took and this improved his back pain. He had no urinary symptoms such as frequency or dysuria. Throughout the remainder of the day he apparently felt okay but at 4 AM 06/03/2012 he again woke up this time with shaking chills, fever and he felt sweaty. He went back to sleep but when he awakened later in the morning he felt very weak and tired. Later in the morning he had further chills and was unable to eat. He sat down in a chair and wasn't as active as usual. His wife he came concerned about his overall status so she called 911. The patient was transported to [facility] emergency department where his systolic blood pressure was found to be relatively low in the 90s and the patient was tachycardic with a heart rate of 117. Physical exam was otherwise unremarkable. The patient had a mild leukocytosis of 16,000 but his hemoglobin seemed to be elevated and hemoconcentrated at 16.8. His lactic acid was also elevated at 3.5 and his urinalysis appeared consistent with an acute urinary tract infection. Chest x-ray was negative for any source of infection or any volume overload. Because he had a pattern of hypertension that responded to IV fluids with a likely source of infection from the urinary tract it was felt the patient had systemic inflammatory response syndrome and was admitted to the step down unit for further monitoring and evaluation. Pulmonary critical care medicine was consulted and did not feel that the patient met criteria for septic shock protocol.  Assessment/Plan:  SIRS (systemic  inflammatory response syndrome)/Leukocytosis/ Fever *source is now known to have been gangrenous cholecystitis *Resolved  Cholelithiasis with acute on chronic cholecystitis status post laparoscopic cholecystectomy 06/06/2012 *Ultrasound revealed large gallstone as well as thickened gallbladder wall consistent with chronic cholecystitis and CT scan revealed hepatic abscess *As per general surgery no abscess seen during operation *Acute hepatitis panel negative *Plain abdominal x-ray showed no evidence of obstruction or ileus *Surgery advancing diet  ? UTI (lower urinary tract infection) *Urine culture negative   Thrombocytopenia *Likely secondary to gram-negative infection *Continue to follow CBC  Klebsiella gram-negative bacteremia/diphtheroid Gram-positive bacteremia * Klebsiella is pan sensitive-MIC for Primaxin as well as Cipro is less than 0.25 so once proves tolerating solid diet for greater than 24 hours can transition from Primaxin to Cipro orally *Suspected source is gallbladder  *Likely will need a total of 2 weeks duration antibiotics. Surgery also recommends same duration antibiotic therapy to treat gangrenous cholecystitis  Atrial fibrillation with uncontrolled ventricular response *Continues with some tachycardia but not as significant as preoperatively *Home medications clarified as Cardizem CD and Pacerone *tolerated transition from IV amiodarone to home medications of Pacerone + Cardizem with only minimal tachycardia with exertion *Appreciate cardiology consultation preoperatively for cardiac clearance and management of atrial fibrillation *Atrial fibrillation was new diagnosis for this patient only a few weeks prior to admission and he will eventually undergo elective cardioversion procedure as an outpatient as well as outpatient stress test  Hypotension/ Dehydration *Resolved *Multi-factorial related to gram-negative infection as well as volume depletion   Chronic  warfarin anticoagulation  with Mild coagulopathy *Patient on chronic Coumadin and INR had increased slightly since admission and likely was due to acute infection as well as antibiotic usage *Dr. Ezzard Standing approved resuming Coumadin on 06/08/2012 without therapeutic anticoagulation bridging with heparin or Lovenox  *Management per pharmacy *Reversal of anticoagulation initiated on 06/06/2012 with subcutaneous vitamin K as well as 2 units of FFP. Surgery also ordered additional FFP on morning immediately preceding surgery. *OP INR should be followed in the [facility] coumadin clinic. Dr Katrinka Blazing has notified the coumadin clinic of patient's hospital stay. Please call [phone] or speak to the cardiologist on-call for [facility] to schedule clinic appointment at time of d/c. Will need to know the discharge coumadin dose.  Scrotal edema *Very mild and not unexpected given patient's presentation with hypotension and requirement of large volume fluid resuscitation *Currently no signs of associated cellulitic skin changes  BPH (benign prostatic hyperplasia)  Lumbar disc herniation *Seen on MRI spine this admission-no infection/abscess or osteomyelitis seen *Patient denies lower extremity weakness, numbness, tingling or pain second of for additional workup to primary care physician  Gout  DVT prophylaxis: Chronically anticoagulated on Coumadin with therapeutic INR at presentation. Coumadin reversed for surgery 06/06/2012. Postoperative DVT prophylaxis at discretion of surgery team Code Status: Full Disposition Plan: Transfer to telemetry floor  Consultants: None  Procedures: None  Antibiotics: Vancomycin IV 8/4 >>>8/5 Primaxin IV 8/4 >>> Ciprofloxacin IV 8/4 >>>8/5  HPI/Subjective: Alert and sitting up in chair at bedside. Reports wants to walk. Also complaining of scrotal edema. Tolerating full liquid   Objective: Blood pressure 132/93, pulse 91, temperature 98.6 F (37 C), temperature  source Oral, resp. rate 20, height 5\' 11"  (1.803 m), weight 101.4 kg (223 lb 8.7 oz), SpO2 96.00%.  Intake/Output Summary (Last 24 hours) at 06/08/12 1124 Last data filed at 06/08/12 0854  Gross per 24 hour  Intake   1665 ml  Output  697.5 ml  Net  967.5 ml     Exam: General: No acute respiratory distress, appears pale Lungs: Clear to auscultation bilaterally without wheezes or crackles, room air Cardiovascular: Irregular rate and rhythm without murmur gallop or rub normal S1 and S2, atrial fibrillation with ventricular rates below 100 Abdomen: Nontender, slightly distended, soft, bowel sounds positive but hypoactive, no rebound, no ascites, no appreciable mass, surgical dressings dry Genitourinary: Mild scrotal edema Musculoskeletal: No significant cyanosis, clubbing of extremities Neurological: Patient alert and oriented x3, moves all extremities x4, exam nonfocal  Data Reviewed: Basic Metabolic Panel:  Lab 06/08/12 4540 06/07/12 0542 06/06/12 0340 06/05/12 0207 06/05/12 0152 06/04/12 0450  NA 139 137 139 -- 137 138  K 3.1* 3.6 3.5 -- 3.2* 3.6  CL 106 107 105 -- 104 106  CO2 23 19 20  -- 19 22  GLUCOSE 96 197* 97 -- 121* 122*  BUN 14 18 14  -- 18 22  CREATININE 0.84 0.83 0.78 -- 0.90 1.18  CALCIUM 8.1* 8.1* 8.7 -- 8.3* 8.1*  MG -- -- 2.0 1.7 -- --  PHOS -- -- -- 2.2* -- --   Liver Function Tests:  Lab 06/08/12 0425 06/06/12 1739 06/06/12 0340 06/05/12 0152 06/04/12 0450  AST 35 59* 50* 71* 82*  ALT 35 48 52 71* 69*  ALKPHOS 198* 200* 210* 134* 89  BILITOT 3.1* 4.0* 4.1* 2.5* 2.2*  PROT 5.6* 5.7* 6.2 6.1 5.4*  ALBUMIN 2.1* 2.3* 2.7* 2.8* 2.6*   CBC:  Lab 06/08/12 0425 06/06/12 0750 06/05/12 0152 06/04/12 0450 06/03/12 1135  WBC 13.5* 15.0* 11.6*  17.6* 16.1*  NEUTROABS -- 12.3* 10.6* -- 15.3*  HGB 13.1 13.7 15.2 14.6 16.8  HCT 39.1 40.1 44.6 44.3 49.2  MCV 82.5 83.4 83.8 85.5 85.3  PLT 146* 108* 99* 110* 157   Cardiac Enzymes:  Lab 06/03/12 1135  CKTOTAL 123    CKMB 2.0  CKMBINDEX --  TROPONINI <0.30    Recent Results (from the past 240 hour(s))  URINE CULTURE     Status: Normal   Collection Time   06/03/12  2:53 PM      Component Value Range Status Comment   Specimen Description URINE, CLEAN CATCH   Final    Special Requests NONE   Final    Culture  Setup Time 06/03/2012 20:23   Final    Colony Count NO GROWTH   Final    Culture NO GROWTH   Final    Report Status 06/05/2012 FINAL   Final   CULTURE, BLOOD (ROUTINE X 2)     Status: Normal   Collection Time   06/03/12  3:20 PM      Component Value Range Status Comment   Specimen Description BLOOD LEFT ARM   Final    Special Requests BOTTLES DRAWN AEROBIC AND ANAEROBIC   Final    Culture  Setup Time 06/03/2012 20:23   Final    Culture     Final    Value: KLEBSIELLA PNEUMONIAE     Note: Gram Stain Report Called to,Read Back By and Verified With: [facility] MILLS @ 1032 06/04/12 BY KRAWS   Report Status 06/06/2012 FINAL   Final    Organism ID, Bacteria KLEBSIELLA PNEUMONIAE   Final   CULTURE, BLOOD (ROUTINE X 2)     Status: Normal   Collection Time   06/03/12  8:03 PM      Component Value Range Status Comment   Specimen Description BLOOD RIGHT HAND   Final    Special Requests BOTTLES DRAWN AEROBIC AND ANAEROBIC 10CC EA   Final    Culture  Setup Time 06/04/2012 02:52   Final    Culture     Final    Value: DIPHTHEROIDS(CORYNEBACTERIUM SPECIES)     Note: Standardized susceptibility testing for this organism is not available.     Note: Gram Stain Report Called to,Read Back By and Verified With: TIM IRBY 06/05/2012 6:29AM YIMSU   Report Status 06/07/2012 FINAL   Final   CULTURE, BLOOD (ROUTINE X 2)     Status: Normal (Preliminary result)   Collection Time   06/03/12  8:03 PM      Component Value Range Status Comment   Specimen Description BLOOD LEFT HAND   Final    Special Requests BOTTLES DRAWN AEROBIC ONLY 5CC   Final    Culture  Setup Time 06/04/2012 02:52   Final    Culture     Final     Value:        BLOOD CULTURE RECEIVED NO GROWTH TO DATE CULTURE WILL BE HELD FOR 5 DAYS BEFORE ISSUING A FINAL NEGATIVE REPORT   Report Status PENDING   Incomplete   MRSA PCR SCREENING     Status: Normal   Collection Time   06/03/12  9:00 PM      Component Value Range Status Comment   MRSA by PCR NEGATIVE  NEGATIVE Final      Studies:  Recent x-ray studies have been reviewed in detail by the Attending Physician  Scheduled Meds:  Reviewed in detail by the Attending  Physician   Junious Silk, ANP [facility] Office  [phone] Pager [phone]  On-Call/Text Page:      [email or web address]      password [email or web address]  If 7PM-7AM, please contact night-coverage [email or web address] Password [email or web address] 06/08/2012, 11:24 AM   LOS: 5 days    I have personally examined this patient and reviewed the entire database. I have reviewed the above note, made any necessary editorial changes, and agree with its content.  Lonia Blood, MD [facility]

## 2012-06-08 NOTE — Progress Notes (Signed)
Pt VSS, education provided regarding transport, pt verbalized understanding of transfer. Pt transported to 4742. Report called to RN, all questions answered.

## 2012-06-08 NOTE — Progress Notes (Signed)
ANTICOAGULATION CONSULT NOTE - Initial Consult  Pharmacy Consult for Coumadin Indication: atrial fibrillation  Allergies  Allergen Reactions  . Penicillins     unknown    Patient Measurements: Height: 5\' 11"  (180.3 cm) Weight: 223 lb 8.7 oz (101.4 kg) IBW/kg (Calculated) : 75.3   Vital Signs: Temp: 98.3 F (36.8 C) (08/09 0400) Temp src: Oral (08/09 0400) BP: 132/82 mmHg (08/09 0417) Pulse Rate: 86  (08/09 0417)  Labs:  Basename 06/08/12 0425 06/07/12 0542 06/06/12 0750 06/06/12 0340  HGB 13.1 -- 13.7 --  HCT 39.1 -- 40.1 --  PLT 146* -- 108* --  APTT -- -- -- --  LABPROT 15.0 17.5* -- 18.4*  INR 1.16 1.41 -- 1.50*  HEPARINUNFRC -- -- -- --  CREATININE 0.84 0.83 -- 0.78  CKTOTAL -- -- -- --  CKMB -- -- -- --  TROPONINI -- -- -- --    Estimated Creatinine Clearance: 102 ml/min (by C-G formula based on Cr of 0.84).   Medical History: Past Medical History  Diagnosis Date  . Hypertension   . Atrial fibrillation   . Arthritis     Medications:  Scheduled:    . allopurinol  300 mg Oral Daily  . amiodarone  200 mg Oral BID  . diltiazem  240 mg Oral Daily  . docusate sodium  100 mg Oral BID  . enoxaparin (LOVENOX) injection  40 mg Subcutaneous Q24H  . imipenem-cilastatin  500 mg Intravenous Q6H  . multivitamin with minerals  1 tablet Oral Daily  . polyethylene glycol  17 g Oral Daily  . potassium chloride  40 mEq Oral BID WC  . senna  2 tablet Oral Daily  . DISCONTD: diltiazem  60 mg Oral Q8H  . DISCONTD: polyethylene glycol  17 g Oral Daily    Assessment: 66 yo male with h/o of atrial fibrillation on Coumadin PTA. Patient with cholelithiasis and acute on chronic cholecystitis s/p laparoscopic cholecystectomy 06/06/12. Pharmacy consulted to manage Coumadin. Patient with order for Lovenox 40mg  daily. Home Coumadin regimen of 5mg  daily, except 2.5mg  on Wednesday, Friday, and Saturday. INRs on admit were 3.06 and 3.32.  Goal of Therapy:  INR 2-3 Monitor  platelets by anticoagulation protocol: Yes   Plan:  1. Coumadin 4 mg po today. 2. Daily PT / INR.  Emeline Gins 06/08/2012,7:29 AM

## 2012-06-08 NOTE — Progress Notes (Signed)
Patient Name: Howard Pearson Date of Encounter: 06/08/2012    SUBJECTIVE:: He looks and feels better. No cardiopulmonary complaints. He has not been provided a diet.  TELEMETRY:  Atrial fibrillation with better rate control. He is off IV amiodarone Filed Vitals:   06/08/12 0417 06/08/12 0714 06/08/12 0800 06/08/12 1054  BP: 132/82 141/97  132/93  Pulse: 86 91    Temp:   98.6 F (37 C)   TempSrc:   Oral   Resp: 19 20    Height:      Weight:      SpO2: 92% 96%      Intake/Output Summary (Last 24 hours) at 06/08/12 1136 Last data filed at 06/08/12 0854  Gross per 24 hour  Intake   1665 ml  Output  697.5 ml  Net  967.5 ml    LABS: Basic Metabolic Panel:  Basename 06/08/12 0425 06/07/12 0542 06/06/12 0340  NA 139 137 --  K 3.1* 3.6 --  CL 106 107 --  CO2 23 19 --  GLUCOSE 96 197* --  BUN 14 18 --  CREATININE 0.84 0.83 --  CALCIUM 8.1* 8.1* --  MG -- -- 2.0  PHOS -- -- --   CBC:  Basename 06/08/12 0425 06/06/12 0750  WBC 13.5* 15.0*  NEUTROABS -- 12.3*  HGB 13.1 13.7  HCT 39.1 40.1  MCV 82.5 83.4  PLT 146* 108*    Radiology/Studies:  No new data  Physical Exam: Blood pressure 132/93, pulse 91, temperature 98.6 F (37 C), temperature source Oral, resp. rate 20, height 5\' 11"  (1.803 m), weight 101.4 kg (223 lb 8.7 oz), SpO2 96.00%. Weight change:    Appears mildly jaundiced.  Lung fields are clear. Bases are also clear. No wheezing is heard.  The cardiac exam reveals an irregularly irregular rhythm.  The extremities reveal no edema.  He is neurologically intact.  ASSESSMENT:  1. Stable chronic diastolic heart failure, with improved rate control  2. Atrial  fibrillation with better rate control and stable.  3. Hypertension  4. He needs chronic anticoagulation therapy with Coumadin due to atrial fibrillation and a high CHADS score\  Plan:  1. Resume usual cardiac therapy at prior doses.  2. Resume coumadin at the maintainence dose when safe.  INR should be followed in the [facility] coumadin clinic. Have notified our coumadin clinic of his hospital stay. Please call [phone] or speak to the cardiologist on-call for [facility] to schedule clinic appointment at time of d/c. Will need to know the discharge coumadin dose.  Selinda Eon 06/08/2012, 11:36 AM

## 2012-06-09 DIAGNOSIS — B370 Candidal stomatitis: Secondary | ICD-10-CM

## 2012-06-09 LAB — BASIC METABOLIC PANEL
BUN: 11 mg/dL (ref 6–23)
CO2: 26 mEq/L (ref 19–32)
Calcium: 8.6 mg/dL (ref 8.4–10.5)
GFR calc non Af Amer: 88 mL/min — ABNORMAL LOW (ref >90)
Glucose, Bld: 104 mg/dL — ABNORMAL HIGH (ref 70–99)

## 2012-06-09 MED ORDER — POLYETHYLENE GLYCOL 3350 17 G PO PACK
17.0000 g | PACK | Freq: Every day | ORAL | Status: DC | PRN
  Filled 2012-06-09: qty 1

## 2012-06-09 MED ORDER — NYSTATIN 100000 UNIT/ML MT SUSP
5.0000 mL | Freq: Four times a day (QID) | OROMUCOSAL | Status: DC
  Administered 2012-06-09 – 2012-06-10 (×4): 500000 [IU] via ORAL
  Filled 2012-06-09 (×6): qty 5

## 2012-06-09 MED ORDER — WARFARIN SODIUM 5 MG PO TABS
5.0000 mg | ORAL_TABLET | Freq: Once | ORAL | Status: AC
  Administered 2012-06-09: 5 mg via ORAL
  Filled 2012-06-09: qty 1

## 2012-06-09 NOTE — Progress Notes (Addendum)
[facility] PROGRESS NOTE  Howard Pearson BJY:[id number] DOB: 1946-05-20 DOA: 06/03/2012 PCP: No primary provider on file.  Assessment/Plan: Principal Problem:  *SIRS (systemic inflammatory response syndrome): source is now known to have been gangrenous cholecystitis.   resolved  Active Problems: Oral Candidiasis:  Will prescribe nystatin oral solution.    Atrial fibrillation with controlled ventricular response:  Continues with some tachycardia but not as significant as preoperatively   Home medications clarified as Cardizem CD and Pacerone   Tolerated transition from IV amiodarone to home medications of Pacerone + Cardizem with only minimal  tachycardia with exertion  Atrial fibrillation was new diagnosis for this patient only a few weeks prior to admission and he will eventually undergo elective cardioversion procedure as an outpatient as well as outpatient stress test   Gram-negative bacteremia/Klebsiella:  Klebsiella is pan sensitive-MIC for Primaxin as well as Cipro is less than 0.25 so once proves tolerating solid diet for   greater than 24 hours can transition from Primaxin to Cipro orally   Suspected source is gallbladder   Likely will need a total of 2 weeks duration antibiotics. Surgery also recommends same duration antibiotic therapy to  treat gangrenous cholecystitis     Code Status: Full code Family Communication: Wife at bedside and updated Disposition Plan: Home at the time of discharge  Ewel Lona A.  [facility] Pager [phone]. If 8PM-8AM, please contact night-coverage at [email or web address], password [facility] 06/09/2012, 6:00 PM  LOS: 6 days   Brief narrative: 66 year old male patient with chronic atrial fibrillation on Coumadin as well as hypertension. Was in usual state of health when he awakened at 2 AM on the morning of 06/02/2012 with low back pain. He had some leftover Vicodin which he took and this improved his back pain. He had no urinary  symptoms such as frequency or dysuria. Throughout the remainder of the day he apparently felt okay but at 4 AM 06/03/2012 he again woke up this time with shaking chills, fever and he felt sweaty. He went back to sleep but when he awakened later in the morning he felt very weak and tired. Later in the morning he had further chills and was unable to eat. He sat down in a chair and wasn't as active as usual. His wife he came concerned about his overall status so she called 911. The patient was transported to [facility] emergency department where his systolic blood pressure was found to be relatively low in the 90s and the patient was tachycardic with a heart rate of 117. Physical exam was otherwise unremarkable. The patient had a mild leukocytosis of 16,000 but his hemoglobin seemed to be elevated and hemoconcentrated at 16.8. His lactic acid was also elevated at 3.5 and his urinalysis appeared consistent with an acute urinary tract infection. Chest x-ray was negative for any source of infection or any volume overload. Because he had a pattern of hypertension that responded to IV fluids with a likely source of infection from the urinary tract it was felt the patient had systemic inflammatory response syndrome and was admitted to the step down unit for further monitoring    Consultants:  Dr. skeins-cardiology  Dr. Reece Packer surgery  Procedures:  Status post cholecystectomy  Antibiotics: Vancomycin IV 8/4 >>>8/5  Primaxin IV 8/4 >>>  Ciprofloxacin IV 8/4 >>>8/5   HPI/Subjective: Patient up ambulating today. States he feels very well. Patient's wife at the bedside.  Objective: Filed Vitals:   06/08/12 2013 06/09/12 0442 06/09/12 1049 06/09/12 1400  BP:  146/86 134/87 133/88 147/81  Pulse: 86 77 87 84  Temp: 99.1 F (37.3 C) 98.8 F (37.1 C)  98.3 F (36.8 C)  TempSrc: Oral Oral  Oral  Resp: 18 18 18 18   Height:      Weight:  112.628 kg (248 lb 4.8 oz)    SpO2: 98% 97% 97% 98%    Weight change:   Intake/Output Summary (Last 24 hours) at 06/09/12 1800 Last data filed at 06/09/12 1742  Gross per 24 hour  Intake   1360 ml  Output   2200 ml  Net   -840 ml    General: Alert, awake, oriented x3, in no acute distress.  Lungs: Clear to auscultation bilaterally without wheezes or crackles, room air  Cardiovascular: Irregular rate and rhythm without murmur gallop or rub normal S1 and S2 Abdomen: Soft, nontender, mild distention,  Hypoactive bowel sounds , no rebound, surgical dressings dry  Genitourinary: Mild scrotal edema  Musculoskeletal: No significant cyanosis, clubbing of extremities  Neurological: Patient alert and oriented x3, moves all extremities x4, exam nonfocal      Data Reviewed: Basic Metabolic Panel:  Lab 06/09/12 1027 06/08/12 0425 06/07/12 0542 06/06/12 0340 06/05/12 0207 06/05/12 0152  NA 141 139 137 139 -- 137  K 3.6 3.1* 3.6 3.5 -- 3.2*  CL 106 106 107 105 -- 104  CO2 26 23 19 20  -- 19  GLUCOSE 104* 96 197* 97 -- 121*  BUN 11 14 18 14  -- 18  CREATININE 0.84 0.84 0.83 0.78 -- 0.90  CALCIUM 8.6 8.1* 8.1* 8.7 -- 8.3*  MG -- -- -- 2.0 1.7 --  PHOS -- -- -- -- 2.2* --   Liver Function Tests:  Lab 06/08/12 0425 06/06/12 1739 06/06/12 0340 06/05/12 0152 06/04/12 0450  AST 35 59* 50* 71* 82*  ALT 35 48 52 71* 69*  ALKPHOS 198* 200* 210* 134* 89  BILITOT 3.1* 4.0* 4.1* 2.5* 2.2*  PROT 5.6* 5.7* 6.2 6.1 5.4*  ALBUMIN 2.1* 2.3* 2.7* 2.8* 2.6*   No results found for this basename: LIPASE:5,AMYLASE:5 in the last 168 hours No results found for this basename: AMMONIA:5 in the last 168 hours CBC:  Lab 06/08/12 0425 06/06/12 0750 06/05/12 0152 06/04/12 0450 06/03/12 1135  WBC 13.5* 15.0* 11.6* 17.6* 16.1*  NEUTROABS -- 12.3* 10.6* -- 15.3*  HGB 13.1 13.7 15.2 14.6 16.8  HCT 39.1 40.1 44.6 44.3 49.2  MCV 82.5 83.4 83.8 85.5 85.3  PLT 146* 108* 99* 110* 157   Cardiac Enzymes:  Lab 06/03/12 1135  CKTOTAL 123  CKMB 2.0  CKMBINDEX --   TROPONINI <0.30   BNP (last 3 results) No results found for this basename: PROBNP:3 in the last 8760 hours CBG: No results found for this basename: GLUCAP:5 in the last 168 hours  Recent Results (from the past 240 hour(s))  URINE CULTURE     Status: Normal   Collection Time   06/03/12  2:53 PM      Component Value Range Status Comment   Specimen Description URINE, CLEAN CATCH   Final    Special Requests NONE   Final    Culture  Setup Time 06/03/2012 20:23   Final    Colony Count NO GROWTH   Final    Culture NO GROWTH   Final    Report Status 06/05/2012 FINAL   Final   CULTURE, BLOOD (ROUTINE X 2)     Status: Normal   Collection Time   06/03/12  3:20 PM  Component Value Range Status Comment   Specimen Description BLOOD LEFT ARM   Final    Special Requests BOTTLES DRAWN AEROBIC AND ANAEROBIC   Final    Culture  Setup Time 06/03/2012 20:23   Final    Culture     Final    Value: KLEBSIELLA PNEUMONIAE     Note: Gram Stain Report Called to,Read Back By and Verified With: [facility] MILLS @ 1032 06/04/12 BY KRAWS   Report Status 06/06/2012 FINAL   Final    Organism ID, Bacteria KLEBSIELLA PNEUMONIAE   Final   CULTURE, BLOOD (ROUTINE X 2)     Status: Normal   Collection Time   06/03/12  8:03 PM      Component Value Range Status Comment   Specimen Description BLOOD RIGHT HAND   Final    Special Requests BOTTLES DRAWN AEROBIC AND ANAEROBIC 10CC EA   Final    Culture  Setup Time 06/04/2012 02:52   Final    Culture     Final    Value: DIPHTHEROIDS(CORYNEBACTERIUM SPECIES)     Note: Standardized susceptibility testing for this organism is not available.     Note: Gram Stain Report Called to,Read Back By and Verified With: TIM IRBY 06/05/2012 6:29AM YIMSU   Report Status 06/07/2012 FINAL   Final   CULTURE, BLOOD (ROUTINE X 2)     Status: Normal (Preliminary result)   Collection Time   06/03/12  8:03 PM      Component Value Range Status Comment   Specimen Description BLOOD LEFT HAND    Final    Special Requests BOTTLES DRAWN AEROBIC ONLY 5CC   Final    Culture  Setup Time 06/04/2012 02:52   Final    Culture     Final    Value:        BLOOD CULTURE RECEIVED NO GROWTH TO DATE CULTURE WILL BE HELD FOR 5 DAYS BEFORE ISSUING A FINAL NEGATIVE REPORT   Report Status PENDING   Incomplete   MRSA PCR SCREENING     Status: Normal   Collection Time   06/03/12  9:00 PM      Component Value Range Status Comment   MRSA by PCR NEGATIVE  NEGATIVE Final      Studies: Dg Chest 2 View  06/03/2012  *RADIOLOGY REPORT*  Clinical Data: Weakness and chills  CHEST - 2 VIEW  Comparison: None.  Findings: The heart and pulmonary vascularity are within normal limits.  The lungs are clear bilaterally.  No acute bony abnormality is seen.  IMPRESSION: No acute abnormality is noted.  Original Report Authenticated By: Phillips Odor, M.D.   Dg Cholangiogram Operative  06/06/2012  *RADIOLOGY REPORT*  Clinical Data:   Acute cholecystitis  INTRAOPERATIVE CHOLANGIOGRAM  Technique:  Cholangiographic images from the C-arm fluoroscopic device were submitted for interpretation post-operatively.  Please see the procedural report for the amount of contrast and the fluoroscopy time utilized.  Comparison:  CT abdomen pelvis of 06/05/2012  Findings:  Contrast was injected via the cystic duct.  There is good filling of the cystic duct and common bile duct with free passage of contrast into the duodenal loop.  No filling defect or obstruction is seen.  IMPRESSION: Negative intraoperative cholangiogram.  Original Report Authenticated By: Juline Patch, M.D.   Mr Lumbar Spine W Wo Contrast  06/03/2012  *RADIOLOGY REPORT*  Clinical Data: Back pain.  Sepsis.  Question diskitis.  MRI LUMBAR SPINE WITHOUT AND WITH CONTRAST  Technique:  Multiplanar and multiecho pulse sequences of the lumbar spine were obtained without and with intravenous contrast.  Contrast: 20mL MULTIHANCE GADOBENATE DIMEGLUMINE 529 MG/ML IV SOLN the  Comparison:  None.  Findings: Normal signal is present in the conus medullaris which terminates at L1-2, within normal limits. Slight retrolisthesis is present at L1-2.  Marrow signal, vertebral body heights, and alignment are otherwise normal.  Limited imaging of the abdomen is unremarkable.  No significant disc edema is present.  There is no pathologic enhancement of the discs or endplates.  T12-L1:  Negative.  L1-2:  A broad-based disc herniation is present.  Mild facet hypertrophy is evident.  Mild right foraminal narrowing is evident.  L2-3:  A mild broad-based disc bulge is present.  There is no significant stenosis.  L3-4:  A mild broad-based disc bulge is present.  Short pedicles are evident.  There is no significant stenosis.  L4-5:  A broad-based disc herniation is asymmetric to the left. Mild facet hypertrophy and short pedicles are noted.  This contributes to mild left foraminal narrowing.  L5-S1:  A shallow central disc protrusion is associated with an annular tear.  There is no significant stenosis.  IMPRESSION:  1.  No abnormal disc edema or enhancement to suggest diskitis. 2.  Slight retrolisthesis and disc herniation at L1-2 with mild right foraminal narrowing. 3.  Leftward disc herniation at L4-5 with short pedicles and facet hypertrophy results in mild left foraminal narrowing. 4.  Disc bulging and short pedicles at L2-3 and L3-4 without significant stenosis. 5.  Shallow central disc protrusion and annular tear at L5-S1 without significant stenosis.  Original Report Authenticated By: Jamesetta Orleans. MATTERN, M.D.   US Abdomen Complete  06/05/2012  *RADIOLOGY REPORT*  Clinical Data:  Increased LFTs, question cholecystitis  COMPLETE ABDOMINAL ULTRASOUND  Comparison:  Renal ultrasound 06/03/2012  Findings:  Gallbladder:  There is a gallstone in gallbladder neck region measures 2.2 cm.  Thickening of gallbladder wall up to 6.4 mm thickness.  Echogenic foci of gallbladder wall probable represents cholesterol  polyps.  There is no sonographic Murphy's sign.  Common bile duct:  Measures 6.3 mm in diameter.  Liver:  No focal lesion identified.  Within normal limits in parenchymal echogenicity.  IVC:  Appears normal.  Pancreas:  Limited assessment due to abundant bowel gas  Spleen:  Measures measures 9 cm in length.  Accessory splenule is noted measures 1.2 x 1 cm.  Right Kidney:  Measures 11.6 cm in length.  No hydronephrosis or diagnostic renal calculus  Left Kidney:  Measures 11.5 cm in length.  No hydronephrosis. Echogenic foci within parenchyma may represent nonobstructive calcifications of vascular calcifications.  The largest measures 6 mm.  Abdominal aorta:  No aneurysm identified. Measures up to 2.9 cm in diameter.  Small bilateral pleural effusion.  Trace abdominal ascites.  IMPRESSION:  1.  There is a gallstone in gallbladder neck region measures 2.4 cm.  Thickening of gallbladder wall up to 6.4 mm.  Although there is no sonographic Murphy's sign clinical correlation is necessary to exclude early cholecystitis.  Echogenic foci of gallbladder wall may represent cholesterol polyps. 2.  Normal CBD. 3.  No hydronephrosis. 4.  Echogenic foci within the left renal parenchyma may represent nonobstructive calculi or vascular calcifications.  Original Report Authenticated By: Natasha Mead, M.D.   Ct Abdomen Pelvis W Contrast  06/05/2012  **ADDENDUM** CREATED: 06/05/2012 18:25:06  Critical Value/emergent results were called by telephone at the time of interpretation on 06/05/2012 at 6:25 p.m. to  Dr. Luisa Hart, who verbally acknowledged these results.  **END ADDENDUM** SIGNED BY: Allayne Gitelman. Jena Gauss, M.D.   06/05/2012  *RADIOLOGY REPORT*  Clinical Data: Fever, leukocytosis, and elevated liver enzymes. Cholelithiasis.  CT ABDOMEN AND PELVIS WITH CONTRAST  Technique:  Multidetector CT imaging of the abdomen and pelvis was performed following the standard protocol during bolus administration of intravenous contrast.  Contrast:  OMNIPAQUE IOHEXOL 300 MG/ML  SOLN  Comparison: Ultrasound dated 06/05/2012  Findings: There is an area of abnormal low density with peripheral slight enhancement in the left lobe of the liver immediately above the gallbladder.  This measures 3.0 x 1.7 by 1.7 cm and has consistent with a hepatic abscess.  The gallbladder is distended with a 2 cm stone in the neck of the gallbladder with diffuse thickening and edema of the gallbladder wall.  There are no dilated bile ducts.  There is minimal atelectasis at the lung bases with tiny effusions.  The spleen, pancreas, adrenal glands, and kidneys with straight no acute abnormality.  There are several small stones in the lower pole of the right kidney.  There is a small amount of ascites in the paracolic gutters.  There are no dilated loops of large or small bowel.  Terminal ileum and appendix are normal.  No diverticular disease.  No significant adenopathy.  IMPRESSION: Probable acute cholecystitis with an adjacent small abscess in the left lobe of the liver.  Small amount of ascites.  Original Report Authenticated By: Gwynn Burly, M.D.   US Renal  06/03/2012  *RADIOLOGY REPORT*  Clinical Data: UTI, back pain  RENAL/URINARY TRACT ULTRASOUND COMPLETE  Comparison:  06/03/2012 MRI  Findings:  Right Kidney:  12.1 cm length.  Normal cortex and echogenicity.  No focal abnormality or mass. No hydronephrosis.  Left Kidney:  Normal cortex and echogenicity.  11.7 cm length.  No hydronephrosis.  Sub centimeter echogenic cortical foci in the upper pole and also in the lower pole without shadowing.  These are nonspecific but could represent small nonshadowing nonobstructing stones versus vascular calcifications.  Bladder:  Normal appearance by ultrasound.  Ureteral jets demonstrated bilaterally.  Prostate gland is enlarged measuring 5 x 5 x 6 cm.  IMPRESSION: No acute finding or hydronephrosis.  Prostate enlargement  Original Report Authenticated By: Judie Petit. Ruel Favors, M.D.   Dg  Abd Portable 1v  06/05/2012  *RADIOLOGY REPORT*  Clinical Data: Deflated the liver function test.  Cholelithiasis.  PORTABLE ABDOMEN - 1 VIEW  Comparison: Ultrasound dated 06/05/2012  Findings: There are no dilated loops of large or small bowel.  The gallstone visible on ultrasound exam is not appreciable on this abdominal radiograph.  No visible free air or free fluid on this supine radiograph.  No acute osseous abnormality.  IMPRESSION: Benign-appearing abdomen.  Original Report Authenticated By: Gwynn Burly, M.D.    Scheduled Meds:   . allopurinol  300 mg Oral Daily  . amiodarone  200 mg Oral BID  . diltiazem  240 mg Oral Daily  . docusate sodium  100 mg Oral BID  . enoxaparin (LOVENOX) injection  40 mg Subcutaneous Q24H  . imipenem-cilastatin  500 mg Intravenous Q6H  . multivitamin with minerals  1 tablet Oral Daily  . nystatin  5 mL Oral QID  . warfarin  4 mg Oral ONCE-1800  . warfarin  5 mg Oral ONCE-1800  . Warfarin - Pharmacist Dosing Inpatient   Does not apply q1800  . DISCONTD: polyethylene glycol  17 g Oral Daily  .  DISCONTD: senna  2 tablet Oral Daily   Continuous Infusions:   . dextrose 5 % and 0.45 % NaCl with KCl 20 mEq/L 50 mL/hr at 06/08/12 2337    Principal Problem:  *SIRS (systemic inflammatory response syndrome) Active Problems:  UTI (lower urinary tract infection)  Atrial fibrillation with controlled ventricular response  Gout  Hypotension  Transaminitis  Dehydration  Leukocytosis  BPH (benign prostatic hyperplasia)  Lumbar disc herniation  Fever  Thrombocytopenia  Gram-negative bacteremia  Gangrenous cholecystitis

## 2012-06-09 NOTE — Progress Notes (Signed)
3 Days Post-Op  Subjective: tol fulls, bm this am, feels better  Objective: Vital signs in last 24 hours: Temp:  [98.7 F (37.1 C)-99.1 F (37.3 C)] 98.8 F (37.1 C) (08/10 0442) Pulse Rate:  [77-103] 87  (08/10 1049) Resp:  [18-20] 18  (08/10 1049) BP: (133-146)/(86-93) 133/88 mmHg (08/10 1049) SpO2:  [96 %-98 %] 97 % (08/10 1049) Weight:  [248 lb 4.8 oz (112.628 kg)-251 lb 1.7 oz (113.9 kg)] 248 lb 4.8 oz (112.628 kg) (08/10 0442) Last BM Date: 06/02/12  Intake/Output from previous day: 08/09 0701 - 08/10 0700 In: 520 [P.O.:120; IV Piggyback:400] Out: 1562 [Urine:1550; Drains:12] Intake/Output this shift: Total I/O In: 480 [P.O.:480] Out: 600 [Urine:600]  General appearance: no distress GI: incisions clean without infection, bs present  BMET  Basename 06/09/12 0545 06/08/12 0425  NA 141 139  K 3.6 3.1*  CL 106 106  CO2 26 23  GLUCOSE 104* 96  BUN 11 14  CREATININE 0.84 0.84  CALCIUM 8.6 8.1*   PT/INR  Basename 06/09/12 0545 06/08/12 0425  LABPROT 16.9* 15.0  INR 1.35 1.16    Anti-infectives: Anti-infectives     Start     Dose/Rate Route Frequency Ordered Stop   06/04/12 2000   imipenem-cilastatin (PRIMAXIN) 500 mg in sodium chloride 0.9 % 100 mL IVPB  Status:  Discontinued        500 mg 200 mL/hr over 30 Minutes Intravenous 4 times per day 06/04/12 1437 06/04/12 1440   06/04/12 1500   imipenem-cilastatin (PRIMAXIN) 500 mg in sodium chloride 0.9 % 100 mL IVPB        500 mg 200 mL/hr over 30 Minutes Intravenous 4 times per day 06/04/12 1440     06/03/12 2200   imipenem-cilastatin (PRIMAXIN) 500 mg in sodium chloride 0.9 % 100 mL IVPB  Status:  Discontinued        500 mg 200 mL/hr over 30 Minutes Intravenous 3 times per day 06/03/12 1810 06/04/12 1437   06/03/12 2000   vancomycin (VANCOCIN) IVPB 1000 mg/200 mL premix  Status:  Discontinued        1,000 mg 200 mL/hr over 60 Minutes Intravenous Every 12 hours 06/03/12 1810 06/04/12 1612   06/03/12 1600    ciprofloxacin (CIPRO) IVPB 400 mg        400 mg 200 mL/hr over 60 Minutes Intravenous  Once 06/03/12 1548 06/03/12 1719          Assessment/Plan: POD 3 lap chole,ioc for gangrenous cholecystitis 1. Po pain meds 2. Will give regular diet 3. Check wbc am, if normal and afebrile then possibly on po abx and could go home from surgical standpoint   LOS: 6 days    [facility] 06/09/2012

## 2012-06-09 NOTE — Progress Notes (Signed)
ANTICOAGULATION CONSULT NOTE - follow up consult  Pharmacy Consult for Coumadin Indication: atrial fibrillation Labs:  Basename 06/09/12 0545 06/08/12 0425 06/07/12 0542  HGB -- 13.1 --  HCT -- 39.1 --  PLT -- 146* --  APTT -- -- --  LABPROT 16.9* 15.0 17.5*  INR 1.35 1.16 1.41  HEPARINUNFRC -- -- --  CREATININE 0.84 0.84 0.83  CKTOTAL -- -- --  CKMB -- -- --  TROPONINI -- -- --    Estimated Creatinine Clearance: 107.4 ml/min (by C-G formula based on Cr of 0.84).  Assessment: 66 yo male with h/o of atrial fibrillation on Coumadin PTA. Patient with cholelithiasis and acute on chronic cholecystitis s/p laparoscopic cholecystectomy 06/06/12. Pharmacy consulted to manage Coumadin. Patient with order for Lovenox 40mg  daily. Home Coumadin regimen of 5mg  daily, except 2.5mg  on Wednesday, Friday, and Saturday. INRs on admit were 3.06 and 3.32.  Goal of Therapy:  INR 2-3 Monitor platelets by anticoagulation protocol: Yes   Plan:  1. Coumadin 5 mg po today. 2. Daily PT / INR.  Mickeal Skinner 06/09/2012,2:04 PM

## 2012-06-09 NOTE — Progress Notes (Signed)
Subjective:  Ambulated hallway, did well, had to stop occasionally. Minor shortness of breath. Reviewed telemetry which shows atrial fibrillation occasional heart rate to 120 beats per minute during ambulation. Currently below 100.  Objective:  Vital Signs in the last 24 hours: Temp:  [97.9 F (36.6 C)-99.1 F (37.3 C)] 98.8 F (37.1 C) (08/10 0442) Pulse Rate:  [77-103] 77  (08/10 0442) Resp:  [18-23] 18  (08/10 0442) BP: (132-146)/(86-93) 134/87 mmHg (08/10 0442) SpO2:  [96 %-98 %] 97 % (08/10 0442) Weight:  [112.628 kg (248 lb 4.8 oz)-113.9 kg (251 lb 1.7 oz)] 112.628 kg (248 lb 4.8 oz) (08/10 0442)  Intake/Output from previous day: 08/09 0701 - 08/10 0700 In: 520 [P.O.:120; IV Piggyback:400] Out: 1562 [Urine:1550; Drains:12]   Physical Exam: General: Well developed, well nourished, in no acute distress. Head:  Normocephalic and atraumatic. Lungs: Clear to auscultation and percussion. Heart: Irregularly irregular, normal rate  No murmur, rubs or gallops.  Abdomen: soft, non-tender, positive bowel sounds. Obese Extremities: No clubbing or cyanosis. No edema. Neurologic: Alert and oriented x 3.    Lab Results:  [facility] 06/08/12 0425  WBC 13.5*  HGB 13.1  PLT 146*    Basename 06/09/12 0545 06/08/12 0425  NA 141 139  K 3.6 3.1*  CL 106 106  CO2 26 23  GLUCOSE 104* 96  BUN 11 14  CREATININE 0.84 0.84  Hepatic Function Panel  Basename 06/08/12 0425 06/06/12 1739  PROT 5.6* --  ALBUMIN 2.1* --  AST 35 --  ALT 35 --  ALKPHOS 198* --  BILITOT 3.1* --  BILIDIR -- 3.2*  IBILI -- 0.8    Telemetry: As described above occasional atrial fibrillation episodes of 120 beats per minute. Majority of A. fib is well rate controlled. Personally viewed.    Assessment/Plan:  Principal Problem:  *SIRS (systemic inflammatory response syndrome) Active Problems:  UTI (lower urinary tract infection)  Atrial fibrillation with controlled ventricular response  Gout  Hypotension  Transaminitis  Dehydration  Leukocytosis  BPH (benign prostatic hyperplasia)  Lumbar disc herniation  Fever  Thrombocytopenia  Gram-negative bacteremia  Gangrenous cholecystitis  1. Atrial fibrillation-currently adequate rate control. Continue amiodarone 200 mg twice a day, diltiazem 240 mg a day. Probably will consider cardioversion in one month after anticoagulation.  2. Chronic anticoagulation-he sees [employer], [address].D. at [facility] cardiology clinic. We will continue to monitor his warfarin. When he is discharged, I relayed to him that he needs to know his Coumadin dosing. Re- started Coumadin yesterday.  3 hypertension-currently reasonable control.  Gangrenous cholecystitis-per surgery.  Howard Pearson 06/09/2012, 10:20 AM

## 2012-06-10 DIAGNOSIS — B961 Klebsiella pneumoniae [K. pneumoniae] as the cause of diseases classified elsewhere: Secondary | ICD-10-CM

## 2012-06-10 DIAGNOSIS — A498 Other bacterial infections of unspecified site: Secondary | ICD-10-CM | POA: Diagnosis present

## 2012-06-10 LAB — BASIC METABOLIC PANEL
BUN: 11 mg/dL (ref 6–23)
Chloride: 105 mEq/L (ref 96–112)
GFR calc Af Amer: >90 mL/min (ref >90)
Potassium: 3.5 mEq/L (ref 3.5–5.1)

## 2012-06-10 LAB — PROTIME-INR: INR: 1.23 (ref 0.00–1.49)

## 2012-06-10 LAB — CBC
HCT: 38.6 % — ABNORMAL LOW (ref 39.0–52.0)
Platelets: 264 10*3/uL (ref 150–400)
RBC: 4.58 MIL/uL (ref 4.22–5.81)
RDW: 17.3 % — ABNORMAL HIGH (ref 11.5–15.5)
WBC: 12.2 10*3/uL — ABNORMAL HIGH (ref 4.0–10.5)

## 2012-06-10 LAB — CULTURE, BLOOD (ROUTINE X 2)

## 2012-06-10 MED ORDER — NYSTATIN 100000 UNIT/ML MT SUSP: 5.0000 mL | Freq: Four times a day (QID) | OROMUCOSAL | Status: AC

## 2012-06-10 MED ORDER — CIPROFLOXACIN HCL 500 MG PO TABS: 500.0000 mg | ORAL_TABLET | Freq: Two times a day (BID) | ORAL | Status: AC

## 2012-06-10 MED ORDER — WARFARIN SODIUM 5 MG PO TABS
5.0000 mg | ORAL_TABLET | Freq: Once | ORAL | Status: DC
  Filled 2012-06-10: qty 1

## 2012-06-10 MED ORDER — DSS 100 MG PO CAPS: 100.0000 mg | ORAL_CAPSULE | Freq: Two times a day (BID) | ORAL | Status: AC

## 2012-06-10 NOTE — Progress Notes (Signed)
4 Days Post-Op  Subjective: First day he has really felt better. Tolerating solid diet, not much pain.  Objective: Vital signs in last 24 hours: Temp:  [98 F (36.7 C)-98.3 F (36.8 C)] 98 F (36.7 C) (08/11 0500) Pulse Rate:  [84-88] 88  (08/11 0500) Resp:  [18] 18  (08/11 0500) BP: (133-147)/(81-88) 145/86 mmHg (08/11 0500) SpO2:  [97 %-98 %] 98 % (08/11 0500) Weight:  [243 lb (110.224 kg)] 243 lb (110.224 kg) (08/11 0607)   Intake/Output from previous day: 08/10 0701 - 08/11 0700 In: 4100 [P.O.:1200; I.V.:2400; IV Piggyback:500] Out: 3671 [Urine:3670; Stool:1] Intake/Output this shift:     General appearance: alert, cooperative and no distress Resp: clear to auscultation bilaterally GI: Soft, not distended, minimal incisional tender. BS +  Incision: healing well  Lab Results:   Basename 06/10/12 0500 06/08/12 0425  WBC 12.2* 13.5*  HGB 12.9* 13.1  HCT 38.6* 39.1  PLT 264 146*   BMET  Basename 06/10/12 0500 06/09/12 0545  NA 140 141  K 3.5 3.6  CL 105 106  CO2 25 26  GLUCOSE 93 104*  BUN 11 11  CREATININE 0.88 0.84  CALCIUM 8.4 8.6   PT/INR  Basename 06/10/12 0500 06/09/12 0545  LABPROT 15.8* 16.9*  INR 1.23 1.35   ABG No results found for this basename: PHART:2,PCO2:2,PO2:2,HCO3:2 in the last 72 hours  MEDS, Scheduled    . allopurinol  300 mg Oral Daily  . amiodarone  200 mg Oral BID  . diltiazem  240 mg Oral Daily  . docusate sodium  100 mg Oral BID  . enoxaparin (LOVENOX) injection  40 mg Subcutaneous Q24H  . imipenem-cilastatin  500 mg Intravenous Q6H  . multivitamin with minerals  1 tablet Oral Daily  . nystatin  5 mL Oral QID  . warfarin  5 mg Oral ONCE-1800  . Warfarin - Pharmacist Dosing Inpatient   Does not apply q1800  . DISCONTD: polyethylene glycol  17 g Oral Daily  . DISCONTD: senna  2 tablet Oral Daily    Studies/Results: No results found.  Assessment: s/p Procedure(s): LAPAROSCOPIC CHOLECYSTECTOMY WITH INTRAOPERATIVE  CHOLANGIOGRAM Patient Active Problem List  Diagnosis  . SIRS (systemic inflammatory response syndrome)  . UTI (lower urinary tract infection)  . Atrial fibrillation with controlled ventricular response  . Gout  . Hypotension  . Transaminitis  . Dehydration  . Leukocytosis  . BPH (benign prostatic hyperplasia)  . Lumbar disc herniation  . Fever  . Thrombocytopenia  . Gram-negative bacteremia  . Gangrenous cholecystitis  . Oral candidiasis    Doing well and able to go home from surgical standpoint  Plan: Discharge Whe OK with primary care. Should remain on antibiotics for at least another week. Needs to follow up with Dr Ezzard Standing in 10-14 days   LOS: 7 days     Currie Paris, MD, [facility], [place] [phone]   06/10/2012 8:13 AM

## 2012-06-10 NOTE — Progress Notes (Signed)
Pt d/c to home with wife. D/c instructions and medications reviewed. Pt and wife state understanding. All questions answered

## 2012-06-10 NOTE — Progress Notes (Signed)
ANTICOAGULATION CONSULT NOTE - follow up consult  Pharmacy Consult for Coumadin Indication: atrial fibrillation Labs:  Basename 06/10/12 0500 06/09/12 0545 06/08/12 0425  HGB 12.9* -- 13.1  HCT 38.6* -- 39.1  PLT 264 -- 146*  APTT -- -- --  LABPROT 15.8* 16.9* 15.0  INR 1.23 1.35 1.16  HEPARINUNFRC -- -- --  CREATININE 0.88 0.84 0.84  CKTOTAL -- -- --  CKMB -- -- --  TROPONINI -- -- --    Estimated Creatinine Clearance: 101.5 ml/min (by C-G formula based on Cr of 0.88).  Assessment: 66 yo male with h/o of atrial fibrillation on Coumadin PTA. Patient with cholelithiasis and acute on chronic cholecystitis s/p laparoscopic cholecystectomy 06/06/12. Pharmacy consulted to manage Coumadin. Patient with order for Lovenox 40mg  daily. Home Coumadin regimen of 5mg  daily, except 2.5mg  on Wednesday, Friday, and Saturday. INRs on admit were 3.06 and 3.32.  Patient also on Imipenem 500mg  IV q6h.  Renal function remains stable.  Note plans to transition to oral ciprofloxacin when able to tolerate diet. Goal of Therapy:  INR 2-3 Monitor platelets by anticoagulation protocol: Yes   Plan:  1. Coumadin 5 mg po today. 2. Daily PT / INR.  Mickeal Skinner 06/10/2012,1:43 PM

## 2012-06-10 NOTE — Evaluation (Signed)
Physical Therapy Evaluation Patient Details Name: Howard Pearson MRN: [id number] DOB: 1945-12-29 Today's Date: 06/10/2012 Time: 1191-4782 PT Time Calculation (min): 28 min  PT Assessment / Plan / Recommendation Clinical Impression  Patient is a 66 yo male admitted with abdominal pain, now s/p cholecystectomy.  Patient is independent with all mobility and gait.  No acute PT needs identified - PT will sign off.  Encouraged patient to continue to walk short distances, building to longer distances.    PT Assessment  Patent does not need any further PT services    Follow Up Recommendations  No PT follow up;Supervision/Assistance - 24 hour    Barriers to Discharge        Equipment Recommendations  None recommended by PT    Recommendations for Other Services     Frequency      Precautions / Restrictions Precautions Precautions: None Restrictions Weight Bearing Restrictions: No         Mobility  Transfers Transfers: Sit to Stand;Stand to Sit Sit to Stand: 7: Independent;With upper extremity assist;With armrests;From chair/3-in-1 Stand to Sit: 7: Independent;With upper extremity assist;With armrests;To chair/3-in-1 Details for Transfer Assistance: Cues for safe hand placement.  Good balance with transfers Ambulation/Gait Ambulation/Gait Assistance: 7: Independent Ambulation Distance (Feet): 192 Feet Assistive device: None Ambulation/Gait Assistance Details: No assist required.  Good balance.  Mild dyspnea (2/4) with gait. Gait Pattern: Within Functional Limits Gait velocity: Slightly slow gait speed      PT Goals  N/A  Visit Information  Last PT Received On: 06/10/12 Assistance Needed: +1    Subjective Data  Subjective: "I've been up walking today." Patient Stated Goal: To go home soon   Prior Functioning  Home Living Lives With: Spouse Available Help at Discharge: Family;Available 24 hours/day Type of Home: House Home Access: Stairs to enter [employer] of Steps: 3 Entrance Stairs-Rails: Right;Can reach both;Left Home Layout: One level Home Adaptive Equipment: None Prior Function Level of Independence: Independent Able to Take Stairs?: Yes Driving: Yes Vocation: Part time employment Communication Communication: No difficulties    Cognition  Overall Cognitive Status: Appears within functional limits for tasks assessed/performed Arousal/Alertness: Awake/alert Orientation Level: Oriented X4 / Intact Behavior During Session: WFL for tasks performed    Extremity/Trunk Assessment Right Upper Extremity Assessment RUE ROM/Strength/Tone: Within functional levels Left Upper Extremity Assessment LUE ROM/Strength/Tone: Within functional levels Right Lower Extremity Assessment RLE ROM/Strength/Tone: Within functional levels RLE Sensation: WFL - Light Touch Left Lower Extremity Assessment LLE ROM/Strength/Tone: Within functional levels LLE Sensation: WFL - Light Touch   Balance Balance Balance Assessed: Yes High Level Balance High Level Balance Activites: Direction changes;Turns;Sudden stops;Head turns (Stepping over objects; speed changes; picking up object ) High Level Balance Comments: No loss of balance with high level balance activities.  End of Session PT - End of Session Activity Tolerance: Patient tolerated treatment well Patient left: in chair;with call bell/phone within reach;with family/visitor present Nurse Communication: Mobility status  GP     Vena Austria 06/10/2012, 12:34 PM Durenda Hurt. Renaldo Fiddler, [facility]hab Services Pager [phone]

## 2012-06-10 NOTE — Discharge Summary (Signed)
Howard Pearson MRN: [id number] DOB/AGE: 68-Apr-1947 68 y.o.  Admit date: 06/03/2012 Discharge date: 06/10/2012  Primary Care Physician:  Lolita Patella, MD   Discharge Diagnoses:   Patient Active Problem List  Diagnosis  . SIRS (systemic inflammatory response syndrome)  . UTI (lower urinary tract infection)  . Atrial fibrillation with controlled ventricular response  . Gout  . Hypotension  . Transaminitis  . Dehydration  . Leukocytosis  . BPH (benign prostatic hyperplasia)  . Lumbar disc herniation  . Fever  . Thrombocytopenia  . Gram-negative bacteremia  . Gangrenous cholecystitis  . Oral candidiasis    DISCHARGE MEDICATION: Medication List  As of 06/10/2012  4:16 PM   TAKE these medications         allopurinol 300 MG tablet   Commonly known as: ZYLOPRIM   Take 300 mg by mouth daily.      amiodarone 200 MG tablet   Commonly known as: PACERONE   Take 200 mg by mouth 2 (two) times daily.      ciprofloxacin 500 MG tablet   Commonly known as: CIPRO   Take 1 tablet (500 mg total) by mouth 2 (two) times daily.      diltiazem 240 MG 24 hr capsule   Commonly known as: CARDIZEM CD   Take 240 mg by mouth daily.      DSS 100 MG Caps   Take 100 mg by mouth 2 (two) times daily.      HYDROcodone-acetaminophen 5-500 MG per tablet   Commonly known as: VICODIN   Take 1-2 tablets by mouth every 6 (six) hours as needed. For pain      multivitamin with minerals Tabs   Take 1 tablet by mouth daily.      nystatin 100000 UNIT/ML suspension   Commonly known as: MYCOSTATIN   Take 5 mLs (500,000 Units total) by mouth 4 (four) times daily.      warfarin 5 MG tablet   Commonly known as: COUMADIN   Take 5 mg by mouth daily. The patient takes 1 tablet (5 mg) daily EXCEPT for 0.5 tablet (2.5 mg) on Wed/Fri/Sat              Consults: Treatment Team:  Md Ccs, MD Lesleigh Noe, MD-cardiology Sandria Bales. Ezzard Standing, M.D.-Gen. surgery   SIGNIFICANT DIAGNOSTIC STUDIES:    Dg Chest 2 View  06/03/2012  *RADIOLOGY REPORT*  Clinical Data: Weakness and chills  CHEST - 2 VIEW  Comparison: None.  Findings: The heart and pulmonary vascularity are within normal limits.  The lungs are clear bilaterally.  No acute bony abnormality is seen.  IMPRESSION: No acute abnormality is noted.  Original Report Authenticated By: Phillips Odor, M.D.   Dg Cholangiogram Operative  06/06/2012  *RADIOLOGY REPORT*  Clinical Data:   Acute cholecystitis  INTRAOPERATIVE CHOLANGIOGRAM  Technique:  Cholangiographic images from the C-arm fluoroscopic device were submitted for interpretation post-operatively.  Please see the procedural report for the amount of contrast and the fluoroscopy time utilized.  Comparison:  CT abdomen pelvis of 06/05/2012  Findings:  Contrast was injected via the cystic duct.  There is good filling of the cystic duct and common bile duct with free passage of contrast into the duodenal loop.  No filling defect or obstruction is seen.  IMPRESSION: Negative intraoperative cholangiogram.  Original Report Authenticated By: Juline Patch, M.D.   Mr Lumbar Spine W Wo Contrast  06/03/2012  *RADIOLOGY REPORT*  Clinical Data: Back pain.  Sepsis.  Question  diskitis.  MRI LUMBAR SPINE WITHOUT AND WITH CONTRAST  Technique:  Multiplanar and multiecho pulse sequences of the lumbar spine were obtained without and with intravenous contrast.  Contrast: 20mL MULTIHANCE GADOBENATE DIMEGLUMINE 529 MG/ML IV SOLN the  Comparison: None.  Findings: Normal signal is present in the conus medullaris which terminates at L1-2, within normal limits. Slight retrolisthesis is present at L1-2.  Marrow signal, vertebral body heights, and alignment are otherwise normal.  Limited imaging of the abdomen is unremarkable.  No significant disc edema is present.  There is no pathologic enhancement of the discs or endplates.  T12-L1:  Negative.  L1-2:  A broad-based disc herniation is present.  Mild facet hypertrophy is  evident.  Mild right foraminal narrowing is evident.  L2-3:  A mild broad-based disc bulge is present.  There is no significant stenosis.  L3-4:  A mild broad-based disc bulge is present.  Short pedicles are evident.  There is no significant stenosis.  L4-5:  A broad-based disc herniation is asymmetric to the left. Mild facet hypertrophy and short pedicles are noted.  This contributes to mild left foraminal narrowing.  L5-S1:  A shallow central disc protrusion is associated with an annular tear.  There is no significant stenosis.  IMPRESSION:  1.  No abnormal disc edema or enhancement to suggest diskitis. 2.  Slight retrolisthesis and disc herniation at L1-2 with mild right foraminal narrowing. 3.  Leftward disc herniation at L4-5 with short pedicles and facet hypertrophy results in mild left foraminal narrowing. 4.  Disc bulging and short pedicles at L2-3 and L3-4 without significant stenosis. 5.  Shallow central disc protrusion and annular tear at L5-S1 without significant stenosis.  Original Report Authenticated By: Jamesetta Orleans. MATTERN, M.D.   US Abdomen Complete  06/05/2012  *RADIOLOGY REPORT*  Clinical Data:  Increased LFTs, question cholecystitis  COMPLETE ABDOMINAL ULTRASOUND  Comparison:  Renal ultrasound 06/03/2012  Findings:  Gallbladder:  There is a gallstone in gallbladder neck region measures 2.2 cm.  Thickening of gallbladder wall up to 6.4 mm thickness.  Echogenic foci of gallbladder wall probable represents cholesterol polyps.  There is no sonographic Murphy's sign.  Common bile duct:  Measures 6.3 mm in diameter.  Liver:  No focal lesion identified.  Within normal limits in parenchymal echogenicity.  IVC:  Appears normal.  Pancreas:  Limited assessment due to abundant bowel gas  Spleen:  Measures measures 9 cm in length.  Accessory splenule is noted measures 1.2 x 1 cm.  Right Kidney:  Measures 11.6 cm in length.  No hydronephrosis or diagnostic renal calculus  Left Kidney:  Measures 11.5 cm in  length.  No hydronephrosis. Echogenic foci within parenchyma may represent nonobstructive calcifications of vascular calcifications.  The largest measures 6 mm.  Abdominal aorta:  No aneurysm identified. Measures up to 2.9 cm in diameter.  Small bilateral pleural effusion.  Trace abdominal ascites.  IMPRESSION:  1.  There is a gallstone in gallbladder neck region measures 2.4 cm.  Thickening of gallbladder wall up to 6.4 mm.  Although there is no sonographic Murphy's sign clinical correlation is necessary to exclude early cholecystitis.  Echogenic foci of gallbladder wall may represent cholesterol polyps. 2.  Normal CBD. 3.  No hydronephrosis. 4.  Echogenic foci within the left renal parenchyma may represent nonobstructive calculi or vascular calcifications.  Original Report Authenticated By: Natasha Mead, M.D.   Ct Abdomen Pelvis W Contrast  06/05/2012  **ADDENDUM** CREATED: 06/05/2012 18:25:06  Critical Value/emergent results were called by  telephone at the time of interpretation on 06/05/2012 at 6:25 p.m. to Dr. Luisa Hart, who verbally acknowledged these results.  **END ADDENDUM** SIGNED BY: Allayne Gitelman. Jena Gauss, M.D.   06/05/2012  *RADIOLOGY REPORT*  Clinical Data: Fever, leukocytosis, and elevated liver enzymes. Cholelithiasis.  CT ABDOMEN AND PELVIS WITH CONTRAST  Technique:  Multidetector CT imaging of the abdomen and pelvis was performed following the standard protocol during bolus administration of intravenous contrast.  Contrast: OMNIPAQUE IOHEXOL 300 MG/ML  SOLN  Comparison: Ultrasound dated 06/05/2012  Findings: There is an area of abnormal low density with peripheral slight enhancement in the left lobe of the liver immediately above the gallbladder.  This measures 3.0 x 1.7 by 1.7 cm and has consistent with a hepatic abscess.  The gallbladder is distended with a 2 cm stone in the neck of the gallbladder with diffuse thickening and edema of the gallbladder wall.  There are no dilated bile ducts.  There is  minimal atelectasis at the lung bases with tiny effusions.  The spleen, pancreas, adrenal glands, and kidneys with straight no acute abnormality.  There are several small stones in the lower pole of the right kidney.  There is a small amount of ascites in the paracolic gutters.  There are no dilated loops of large or small bowel.  Terminal ileum and appendix are normal.  No diverticular disease.  No significant adenopathy.  IMPRESSION: Probable acute cholecystitis with an adjacent small abscess in the left lobe of the liver.  Small amount of ascites.  Original Report Authenticated By: Gwynn Burly, M.D.   US Renal  06/03/2012  *RADIOLOGY REPORT*  Clinical Data: UTI, back pain  RENAL/URINARY TRACT ULTRASOUND COMPLETE  Comparison:  06/03/2012 MRI  Findings:  Right Kidney:  12.1 cm length.  Normal cortex and echogenicity.  No focal abnormality or mass. No hydronephrosis.  Left Kidney:  Normal cortex and echogenicity.  11.7 cm length.  No hydronephrosis.  Sub centimeter echogenic cortical foci in the upper pole and also in the lower pole without shadowing.  These are nonspecific but could represent small nonshadowing nonobstructing stones versus vascular calcifications.  Bladder:  Normal appearance by ultrasound.  Ureteral jets demonstrated bilaterally.  Prostate gland is enlarged measuring 5 x 5 x 6 cm.  IMPRESSION: No acute finding or hydronephrosis.  Prostate enlargement  Original Report Authenticated By: Judie Petit. Ruel Favors, M.D.   Dg Abd Portable 1v  06/05/2012  *RADIOLOGY REPORT*  Clinical Data: Deflated the liver function test.  Cholelithiasis.  PORTABLE ABDOMEN - 1 VIEW  Comparison: Ultrasound dated 06/05/2012  Findings: There are no dilated loops of large or small bowel.  The gallstone visible on ultrasound exam is not appreciable on this abdominal radiograph.  No visible free air or free fluid on this supine radiograph.  No acute osseous abnormality.  IMPRESSION: Benign-appearing abdomen.  Original Report  Authenticated By: Gwynn Burly, M.D.        OTHER PROCEDURES: Laparoscopic cholecystectomy with intraoperative angiogram: 06/06/2012 (please see operative report)  Recent Results (from the past 240 hour(s))  URINE CULTURE     Status: Normal   Collection Time   06/03/12  2:53 PM      Component Value Range Status Comment   Specimen Description URINE, CLEAN CATCH   Final    Special Requests NONE   Final    Culture  Setup Time 06/03/2012 20:23   Final    Colony Count NO GROWTH   Final    Culture NO GROWTH  Final    Report Status 06/05/2012 FINAL   Final   CULTURE, BLOOD (ROUTINE X 2)     Status: Normal   Collection Time   06/03/12  3:20 PM      Component Value Range Status Comment   Specimen Description BLOOD LEFT ARM   Final    Special Requests BOTTLES DRAWN AEROBIC AND ANAEROBIC   Final    Culture  Setup Time 06/03/2012 20:23   Final    Culture     Final    Value: KLEBSIELLA PNEUMONIAE     Note: Gram Stain Report Called to,Read Back By and Verified With: [facility] @ 1032 06/04/12 BY KRAWS   Report Status 06/06/2012 FINAL   Final    Organism ID, Bacteria KLEBSIELLA PNEUMONIAE   Final   CULTURE, BLOOD (ROUTINE X 2)     Status: Normal   Collection Time   06/03/12  8:03 PM      Component Value Range Status Comment   Specimen Description BLOOD RIGHT HAND   Final    Special Requests BOTTLES DRAWN AEROBIC AND ANAEROBIC 10CC EA   Final    Culture  Setup Time 06/04/2012 02:52   Final    Culture     Final    Value: DIPHTHEROIDS(CORYNEBACTERIUM SPECIES)     Note: Standardized susceptibility testing for this organism is not available.     Note: Gram Stain Report Called to,Read Back By and Verified With: TIM IRBY 06/05/2012 6:29AM YIMSU   Report Status 06/07/2012 FINAL   Final   CULTURE, BLOOD (ROUTINE X 2)     Status: Normal   Collection Time   06/03/12  8:03 PM      Component Value Range Status Comment   Specimen Description BLOOD LEFT HAND   Final    Special Requests BOTTLES  DRAWN AEROBIC ONLY 5CC   Final    Culture  Setup Time 06/04/2012 02:52   Final    Culture NO GROWTH 5 DAYS   Final    Report Status 06/10/2012 FINAL   Final   MRSA PCR SCREENING     Status: Normal   Collection Time   06/03/12  9:00 PM      Component Value Range Status Comment   MRSA by PCR NEGATIVE  NEGATIVE Final     BRIEF ADMITTING H & P: 66 year old male patient with chronic atrial fibrillation on Coumadin as well as hypertension. Was in usual state of health when he awakened at 2 AM on the morning of 06/02/2012 with low back pain. He had some leftover Vicodin which he took and this improved his back pain. He had no urinary symptoms such as frequency or dysuria. Throughout the remainder of the day he apparently felt okay but at 4 AM 06/03/2012 he again woke up this time with shaking chills, fever and he felt sweaty. He went back to sleep but when he awakened later in the morning he felt very weak and tired. Later in the morning he had further chills and was unable to eat. He sat down in a chair and wasn't as active as usual. His wife he came concerned about his overall status so she called 911. The patient was transported to [facility] emergency department where his systolic blood pressure was found to be relatively low in the 90s and the patient was tachycardic with a heart rate of 117. Physical exam was otherwise unremarkable. The patient had a mild leukocytosis of 16,000 but his hemoglobin seemed to  be elevated and hemoconcentrated at 16.8. His lactic acid was also elevated at 3.5 and his urinalysis appeared consistent with an acute urinary tract infection. Chest x-ray was negative for any source of infection or any volume overload. Because he had a pattern of hypertension that responded to IV fluids with a likely source of infection from the urinary tract it was felt the patient had systemic inflammatory response syndrome and was admitted to the step down unit for further monitoring and evaluation.  Pulmonary critical care medicine was consulted and did not feel that the patient met criteria for septic shock protocol.    Hospital Course:  Present on Admission:  .SIRS (systemic inflammatory response syndrome): Patient presents with radiology consistent with SIRS and sepsis. In retrospect it was felt to be secondary to gangrenous cholecystitis. Patient was started on empiric antibiotics. At the time of discharge his surface was completely resolved and the patient had normal physiology.   Marland KitchenUTI (lower urinary tract infection): Urine culture negative   .Atrial fibrillation with controlled ventricular response: HR now controlled on Cardizem and amiodarone. Pt to resume usual dosing of coumadin per cardiology and follow up in coumadin clinic as arranged by Dr. Anne Fu on tomorrow 06/11/2012.  Marland Kitchen Chronic warfarin therapy with mild coagulopathy: H. is on chronic Coumadin therapy for his atrial fibrillation. On admission his INR was slightly crease which is felt to be secondary to his state of sepsis. The INR was reversed perioperatively with vitamin K and FFP on 06/06/2012. It was restarted on 06/08/2012 at the time of discharge was subtherapeutic with a 9 out of 1.23. Per Dr. Anne Fu- cardiology it was felt that it was medically safe for the patient to be discharged and follow up with the Coumadin clinic tomorrow for further titration of his medications.  .Hypotension/dehydration: Felt to be associated with sepsis picture associated gram-negative pathogens as well as volume depletion.. The patient had been on hypertensive medications which were initially held. He was resuscitated with fluids and supportive care. At the time of discharge the patient was normotensive and on Cardizem which was controlling his blood pressure well.   .Thrombocytopenia: Felt to be secondary to his state of sepsis. At the time of discharge the patient's platelets had recovered to normal level.   .Klebsiella bacteremia: pt has  been in Imipenem for 7 days and is to continue on Ciprofloxacin for another 7 days to complete a total of 14 days of therapy.  .Gangrenous cholecystitis: PT is s/p Laparoscopic cholecystectomy with intraoperative cholangiogram.  .Liver Abscess:Pt had a liver abscess adjacent to the gallbladder that was drained. The drain was left in gallbladder bed and patient is being discharged home with drain in place. He is to follow up with surgery as an out patient in 10 days.  . Oral candidiasis: The patient was noted to have oral candidiasis likely associated with the prolonged antibiotic therapy. He was treated with nystatin swish and swallow be continued for 5 additional days after discharge  Disposition and Follow-up: Condition at the time of discharge: Good.  The patient will follow with his primary care physician Dr. Neldon Newport in one week. He'll also followup with general surgery Dr. Ezzard Standing in 10 days and with Coumadin clinic on tomorrow as arranged by Dr. Anne Fu.   Discharge Orders    Future Orders Please Complete By Expires   Diet - low sodium heart healthy      Home Health      Questions: Responses:   To provide the following care/treatments  RN    PT    OT   Face-to-face encounter      Comments:   I Howard Grimmer A. certify that this patient is under my care and that I, or a nurse practitioner or physician's assistant working with me, had a face-to-face encounter that meets the physician face-to-face encounter requirements with this patient on 06/10/2012.   Questions: Responses:   The encounter with the patient was in whole, or in part, for the following medical condition, which is the primary reason for home health care cholecystectomy, sepsis, gait abnorrmality, atrial fibrillation   I certify that, based on my findings, the following services are medically necessary home health services Nursing    Physical therapy   My clinical findings support the need for the above services Post  Surgical with wound care needs   Further, I certify that my clinical findings support that this patient is homebound due to: Unable to leave home safely without assistance   To provide the following care/treatments RN    PT    OT   Increase activity slowly         DISCHARGE EXAM:  General: Alert, awake, oriented x3, in no acute distress. Patient states that he feels his very best today since being hospitalized Vital Signs:Blood pressure 141/69, pulse 44, temperature 98 F (36.7 C), temperature source Oral, resp. rate 20, height 5\' 11"  (1.803 m), weight 110.224 kg (243 lb), SpO2 99.00%. Lungs: Clear to auscultation bilaterally without wheezes or crackles, room air  Cardiovascular: Irregular rate and rhythm without murmur gallop or rub normal S1 and S2  Abdomen: Soft, nontender, mild distention, Hypoactive bowel sounds , no rebound, surgical dressings dry  Genitourinary: Mild scrotal edema  Musculoskeletal: No significant cyanosis, clubbing of extremities  Neurological: Patient alert and oriented x3, moves all extremities x4, exam nonfocal     Basename 06/10/12 0500 06/09/12 0545  NA 140 141  K 3.5 3.6  CL 105 106  CO2 25 26  GLUCOSE 93 104*  BUN 11 11  CREATININE 0.88 0.84  CALCIUM 8.4 8.6  MG -- --  PHOS -- --    Basename 06/08/12 0425  AST 35  ALT 35  ALKPHOS 198*  BILITOT 3.1*  PROT 5.6*  ALBUMIN 2.1*   No results found for this basename: LIPASE:2,AMYLASE:2 in the last 72 hours  Basename 06/10/12 0500 06/08/12 0425  WBC 12.2* 13.5*  NEUTROABS -- --  HGB 12.9* 13.1  HCT 38.6* 39.1  MCV 84.3 82.5  PLT 264 146*   Total time for discharge process including face-to-face time greater than 30 minutes Signed: Nikitas Davtyan A. 06/10/2012, 4:16 PM

## 2012-06-10 NOTE — Progress Notes (Signed)
Subjective:  Overall feeling well, laying in bed. Ambulating well. Taking his time.  Objective:  Vital Signs in the last 24 hours: Temp:  [98 F (36.7 C)-98.3 F (36.8 C)] 98 F (36.7 C) (08/11 0500) Pulse Rate:  [84-88] 88  (08/11 0500) Resp:  [18] 18  (08/11 0500) BP: (133-147)/(81-88) 145/86 mmHg (08/11 0500) SpO2:  [97 %-98 %] 98 % (08/11 0500) Weight:  [110.224 kg (243 lb)] 110.224 kg (243 lb) (08/11 0607)  Intake/Output from previous day: 08/10 0701 - 08/11 0700 In: 4100 [P.O.:1200; I.V.:2400; IV Piggyback:500] Out: 3671 [Urine:3670; Stool:1]   Physical Exam: General: Well developed, well nourished, in no acute distress. Head:  Normocephalic and atraumatic. Lungs: Clear to auscultation and percussion. Heart: Irregularly irregular, normal rate.  No murmur, rubs or gallops.  Abdomen: soft, non-tender, positive bowel sounds. Obese Extremities: No clubbing or cyanosis. No edema. Neurologic: Alert and oriented x 3.    Lab Results:  Basename 06/10/12 0500 06/08/12 0425  WBC 12.2* 13.5*  HGB 12.9* 13.1  PLT 264 146*    Basename 06/10/12 0500 06/09/12 0545  NA 140 141  K 3.5 3.6  CL 105 106  CO2 25 26  GLUCOSE 93 104*  BUN 11 11  CREATININE 0.88 0.84   No results found for this basename: TROPONINI:2,CK,MB:2 in the last 72 hours Hepatic Function Panel  Basename 06/08/12 0425  PROT 5.6*  ALBUMIN 2.1*  AST 35  ALT 35  ALKPHOS 198*  BILITOT 3.1*  BILIDIR --  IBILI --   Telemetry: Atrial fibrillation, heart rates mostly below 100. Occasionally peaking into the 120s. This is usually with activity. Personally viewed.     Assessment/Plan:  Principal Problem:  *Gangrenous cholecystitis Active Problems:  SIRS (systemic inflammatory response syndrome)  UTI (lower urinary tract infection)  Atrial fibrillation with controlled ventricular response  Gout  Hypotension  Transaminitis  Dehydration  Leukocytosis  BPH (benign prostatic hyperplasia)  Lumbar disc  herniation  Fever  Thrombocytopenia  Gram-negative bacteremia  Oral candidiasis   1. Atrial fibrillation-continue with amiodarone 200 mg twice a day, diltiazem 240 mg a day. Overall reasonably rate control. We'll probably consider cardioversion in one month after falling anticoagulation. Overall clinically stable.  2. Chronic anticoagulation-he is currently on 5 mg of Coumadin. I instructed him to call [employer], [address].D. with [facility] cardiology clinic tomorrow to have INR checked. He has been on Coumadin in the past. No evidence of bleeding. INR currently 1.23. Note, he is on amiodarone.  3. Hypertension-reasonably controlled.  4. Cholecystitis-okay to discharge per surgery with close followup.  I'm fine with him being discharged as well. Close followup with cardiology.   SKAINS, MARK 06/10/2012, 9:34 AM

## 2012-06-22 ENCOUNTER — Encounter (INDEPENDENT_AMBULATORY_CARE_PROVIDER_SITE_OTHER): Payer: Self-pay | Admitting: Surgery

## 2012-06-22 ENCOUNTER — Ambulatory Visit (INDEPENDENT_AMBULATORY_CARE_PROVIDER_SITE_OTHER): Payer: No Typology Code available for payment source | Admitting: Surgery

## 2012-06-22 VITALS — BP 130/84 | HR 90 | Temp 97.5°F | Resp 14 | Ht 71.0 in | Wt 216.2 lb

## 2012-06-22 DIAGNOSIS — K810 Acute cholecystitis: Secondary | ICD-10-CM

## 2012-06-22 NOTE — Progress Notes (Signed)
[facility]  Ovidio Kin, MD,  FACS [address] [place]  [address] [place], [place]    [address] Phone:  [phone] FAX:  [phone]   Re:   Howard Pearson DOB:   14-May-1946 MRN:   [id number]  ASSESSMENT AND PLAN: 1. LAPAROSCOPIC CHOLECYSTECTOMY WITH INTRAOPERATIVE CHOLANGIOGRAM for gangrenous cholecystitis - D. Keniel Ralston - 06/06/2012   Has done very well.  Recovered with almost no pain.  Return appt PRN  2. Liver abscess adjacent to GB - seen on CT scan  Has done well.  No further treatment necessary.  3. Atrial fibrillation  Ander Slade, Dr. Michaelle Copas PA.  Doing well. 4. Anti-coagulation   Last INR - 3.2  5. Gout  6. BPH (benign prostatic hyperplasia)  7. Lumbar disc herniation  8. History of hypertension  HISTORY OF PRESENT ILLNESS: Howard Pearson is a 66 y.o. (DOB: 1946-05-19)  white male who is a patient of READE,ROBERT ALEXANDER, MD and comes to me today for follow up for surgery on a gangrenous gall bladder.  He has done well.  Is in a good mood.  His wife with him.  He has no concern.  He has had just a little trouble with his bowels.  PHYSICAL EXAM: BP 130/84  Pulse 90  Temp 97.5 F (36.4 C)  Resp 14  Ht 5\' 11"  (1.803 m)  Wt 216 lb 3.2 oz (98.068 kg)  BMI 30.15 kg/m2  Abdomen:  Wounds look good.  DATA REVIEWED: None new.  Ovidio Kin, MD, FACS Office:  [phone]

## 2012-07-17 ENCOUNTER — Other Ambulatory Visit: Payer: Self-pay | Admitting: Cardiology

## 2012-07-17 ENCOUNTER — Other Ambulatory Visit: Payer: Self-pay | Admitting: Interventional Cardiology

## 2012-07-17 NOTE — H&P (Signed)
CC:    1. HS/1WK FOLLOW UP AND SEE JEREMY, possible precardioversion, needs EKG if INR stable.        HPI:  General:  Mr Tripp is a 66 yo male followed by Dr Katrinka Blazing with a hx of Atrial fib/flutter, and diastolic heart failure. He was seen after noted difficulty sleeping, dyspnea on exertion, lower extremity swelling, and fatigue. At Dr Reade's office, he was found to have atrial fibrillation with rapid ventricular response HR up to 130. Dr Katrinka Blazing started him on Diltiazem 240 mg po qd and on Coumadin therapy. Echocardiogram with diastolic dysfunction and EF reduced at 40-45%.. Amiodarone was started with plans after 4 weeks for cardioversion. He was hospitalized 06/03/12 due to low back pain, fever and chills. noted UTI and gangerous cholecystitis and was taken off coumadin with cholecyctectomy performed. During admission pt had fluid retention and states his weight was up to 250 and he was swollen over his entire body including his arms. The swelling has completely resolved. He states he feels back to his usual. no cardiac complaints andhas been therapeutic on coumadin therapy for 4-5 readings. We will now proceed wtih cardioversion since INRs therapeutic for 4 weeks. .  Patient denies chest pain, palpitations, dizziness, syncope,SOB, nor PND.       ROS:  as noted in HPI, all other systems negative, no further abdominal discomfort, no change in bowel habits, no fever, nor cough, no congestion no neurological changes.       Medical History: History of colon polyps, Hyperlipidemia, Atrial flutter 2010, Echo LVH with EF 60% mod LAE, Benign hypertensive heart disease without heart failure, Essential hypertension, benign, Atrial fibrillation, June 2013.        Surgical History: Tooth extraction , Hypertension , cholecystectomy ( gangerous) 8/13.        Family History: Father: deceased 6 yrs Heart attack age 36 Mother: deceased 69 yrs Stroke age 60 Paternal Grand Father: deceased Paternal Grand Mother:  deceased Cancer Maternal Grand Father: deceased Maternal Grand Mother: deceased Stroke Brother 1: alive 19 yrs  No family history of colon cancer,colon polyps,or liver disease.       Social History:  General: History of smoking cigarettes: Former smoker, Quit in year 1986, Pack-year Hx: 25. no Smoking. no Alcohol. no Recreational drug use. Occupation: employed, Oncologist, Airline pilot. Marital Status: married. Children: 1 , Boy 1, girl. Religion: Christian. Seat belt use: yes.        Medications: OTC tylenol as needed for fever and aches and pains as directed , MVI 1 tablet daily, Hydrocodone-Acetaminophen 5-500 MG Capsule 1 capsule as needed for pain every 6 hrs, Indomethacin 50 MG Capsule 1 capsule with food As needed for gout, Diltiazem HCl CR 240 MG Capsule Extended Release 24 Hour 1 capsule every morning on an empty stomach Once a day, Amiodarone HCl 200 MG Tablet 1 tablet twice a day with food, Warfarin Sodium 5 MG Tablet per pharmD 2.5 mg qd except 5 mg W/F/Sun, Allopurinol 300 MG Tablet 1 tablet once a day, Medication List reviewed and reconciled with the patient       Allergies: Penicillin (for allergy).       Objective:     Vitals: Wt 217.6, Wt change -9.2 lb, Ht 69.75, BMI 31.44, Pulse sitting 72, BP sitting 120/80.       Examination:  Cardiology Exam:  GENERAL APPEARANCE: pleasant, NAD, comfortable, obese, male. HEENT: normal. CAROTID UPSTROKE: no bruit, no JVD. JVD: flat. HEART: irregular rate and rhythm, heart rate  controlled, normal S1S2, no rub, no gallop, or click. LUNGS: clear to auscultation, no wheezing/rhonchi/rales. ABDOMEN: soft, non-tender, + bowel sounds. EXTREMITIES: no leg edema. PERIPHERAL PULSES: 2+, bilateral. NEUROLOGIC: grossly intact, cranial nerves intact, gait WNL. MOOD: normal.  swelling completely resolved.       Assessment:     Assessment:  1. Atrial flutter - 427.32 (Primary)    Plan:     1. Atrial flutter Continue Diltiazem HCl CR  Capsule Extended Release 24 Hour, 240 MG, 1 capsule every morning on an empty stomach, Orally, Once a day ; Continue Amiodarone HCl Tablet, 200 MG, 1 tablet, Orally, twice a day with food ; Continue Warfarin Sodium Tablet, 5 MG, per pharmD, Orally, 2.5 mg qd except 5 mg W/F/Sun .  LAB: CBC with Diff    WBC 8.7 4.0-11.0 - K/ul    RBC 5.03 4.20-5.80 - M/uL    HGB 14.2 13.0-17.0 - g/dL    HCT 29.5 28.4-13.2 - %    MCH 28.2 27.0-33.0 - pg    MPV 8.4 7.5-10.7 - fL    MCV 88.5 80.0-94.0 - fL    MCHC 31.8 32.0-36.0 - g/dL L   RDW 44.0 10.2-72.5 - % H   NRBC# 0.01 -    PLT 191 150-400 - K/uL    NEUT % 57.9 43.3-71.9 - %    NRBC% 0.10 - %    LYMPH% 28.9 16.8-43.5 - %    MONO % 10.0 4.6-12.4 - %    EOS % 2.3 0.0-7.8 - %    BASO % 0.9 0.0-1.0 - %    NEUT # 5.0 1.9-7.2 - K/uL    LYMPH# 2.50 1.10-2.70 - K/uL    MONO # 0.9 0.3-0.8 - K/uL H   EOS # 0.2 0.0-0.6 - K/uL    BASO # 0.1 0.0-0.1 - K/uL     Skilynn Durney A 07/16/2012 04:37:00 PM > ok for cardioversion    LAB: Comp Metabolic Panel    GLUCOSE 90 70-99 - mg/dL    BUN 15 3-66 - mg/dL    CREATININE 4.40 3.47-4.25 - mg/dl    eGFR (NON-AFRICAN AMERICAN) 65 >60 - calc    eGFR (AFRICAN AMERICAN) 79 >60 - calc    SODIUM 143 136-145 - mmol/L    POTASSIUM 4.0 3.5-5.5 - mmol/L    CHLORIDE 109 98-107 - mmol/L H   C02 29 22-32 - mg/dL    ANION GAP 9.2 9.5-63.8 - mmol/L    CALCIUM 9.1 8.6-10.3 - mg/dL    T PROTEIN 6.3 7.5-6.4 - g/dL    ALBUMIN 3.9 3.3-2.9 - g/dL    T.BILI 0.7 0.3-1.0 - mg/dL    ALP 87 51-884 - U/L    AST 26 0-39 - U/L    ALT 24 0-52 - U/L     Enzo Treu A 07/16/2012 05:18:17 PM > ok for cardioversion    Diagnostic Imaging:EKG Atrial flutter, Corson,Danielle 07/16/2012 02:18:00 PM >, Chest PA/Lat (Ordered for 07/16/2012)  Reviewed Cardioversion procedure including potential risk including skin irritation, strokes, arrthymias, and reaction to sedatives, INR today 3.5, Coumadin therapy followed today by Alfonse Ras  Pharm D.        Immunizations:        Labs:        Procedure Codes: 16606 EKG I AND R, 85025 ECL CBC PLATELET DIFF, 80053 ECL COMP METABOLIC PANEL, 36415 BLOOD COLLECTION ROUTINE VENIPUNCTURE       Preventive:         Follow Up: HS  pending cardioversoin (Reason: S/P cardioversion)      Provider: Michaell Cowing. Emelda Fear, NP  Patient: Howard Pearson, Howard Pearson DOB: 1945-11-19 Date: 07/16/2012

## 2012-07-19 ENCOUNTER — Ambulatory Visit (HOSPITAL_COMMUNITY): Payer: No Typology Code available for payment source | Admitting: Anesthesiology

## 2012-07-19 ENCOUNTER — Ambulatory Visit (HOSPITAL_COMMUNITY)
Admission: RE | Admit: 2012-07-19 | Discharge: 2012-07-19 | Disposition: A | Discharge status: Home / Self Care | Payer: No Typology Code available for payment source | Source: Ambulatory Visit | Attending: Interventional Cardiology | Admitting: Interventional Cardiology

## 2012-07-19 ENCOUNTER — Encounter (HOSPITAL_COMMUNITY): Payer: Self-pay | Admitting: Anesthesiology

## 2012-07-19 ENCOUNTER — Encounter (HOSPITAL_COMMUNITY)
Admission: RE | Disposition: A | Discharge status: Home / Self Care | Payer: Self-pay | Source: Ambulatory Visit | Attending: Interventional Cardiology

## 2012-07-19 ENCOUNTER — Encounter (HOSPITAL_COMMUNITY): Payer: Self-pay | Admitting: *Deleted

## 2012-07-19 DIAGNOSIS — I4892 Unspecified atrial flutter: Secondary | ICD-10-CM

## 2012-07-19 HISTORY — DX: Polyp of colon: K63.5

## 2012-07-19 HISTORY — DX: Heart failure, unspecified: I50.9

## 2012-07-19 HISTORY — DX: Hyperlipidemia, unspecified: E78.5

## 2012-07-19 HISTORY — PX: CARDIOVERSION: SHX1299

## 2012-07-19 SURGERY — CARDIOVERSION
Anesthesia: Monitor Anesthesia Care | Wound class: Clean

## 2012-07-19 MED ORDER — SODIUM CHLORIDE 0.9 % IV SOLN
INTRAVENOUS | Status: DC
  Administered 2012-07-19: 500 mL via INTRAVENOUS

## 2012-07-19 MED ORDER — AMIODARONE HCL 200 MG PO TABS: 200.0000 mg | ORAL_TABLET | Freq: Every day | ORAL | Status: DC

## 2012-07-19 MED ORDER — SODIUM CHLORIDE 0.9 % IV SOLN
INTRAVENOUS | Status: DC | PRN
  Administered 2012-07-19: 13:00:00 via INTRAVENOUS

## 2012-07-19 MED ORDER — LIDOCAINE HCL (CARDIAC) 20 MG/ML IV SOLN
INTRAVENOUS | Status: DC | PRN
  Administered 2012-07-19: 40 mg via INTRAVENOUS

## 2012-07-19 MED ORDER — PROPOFOL 10 MG/ML IV BOLUS
INTRAVENOUS | Status: DC | PRN
  Administered 2012-07-19: 100 mg via INTRAVENOUS

## 2012-07-19 NOTE — Anesthesia Postprocedure Evaluation (Signed)
  Anesthesia Post-op Note  Patient: Howard Pearson  Procedure(s) Performed: Procedure(s) (LRB) with comments: CARDIOVERSION (N/A) - h/p in file drawer  Patient Location: PACU and Short Stay  Anesthesia Type: MAC  Level of Consciousness: awake  Airway and Oxygen Therapy: Patient Spontanous Breathing  Post-op Pain: mild  Post-op Assessment: Post-op Vital signs reviewed  Post-op Vital Signs: Reviewed  Complications: No apparent anesthesia complications

## 2012-07-19 NOTE — CV Procedure (Signed)
Electrical Cardioversion Procedure Note Howard Pearson [id number] [birth year]  Procedure: Electrical Cardioversion Indications:  Atrial Flutter  Time Out: Verified patient identification, verified procedure,medications/allergies/relevent history reviewed, required imaging and test results available.  Performed  Procedure Details  The patient was NPO after midnight. Anesthesia was administered at the beside  by Dr.C. Randa Evens with IV propofol.  Cardioversion was done with synchronized biphasic defibrillation with AP pads with 150 watts.  The patient converted to normal sinus rhythm. The patient tolerated the procedure well   IMPRESSION:  Successful cardioversion of atrial flutter    Howard Pearson 07/19/2012, 2:01 PM

## 2012-07-19 NOTE — H&P (Signed)
  This note is an interval note and also confirms my agreement with the H and P generated by Cristopher Peru, NP. There have been no interval changes in symptoms or clinical state since the H&P. We re-discussed the procedure and the risks of stroke, death, anesthesia complications, skin injury among others. The patient agrees to proceed with the procedure.

## 2012-07-19 NOTE — Preoperative (Signed)
Beta Blockers   Reason not to administer Beta Blockers:Not Applicable 

## 2012-07-19 NOTE — Anesthesia Preprocedure Evaluation (Addendum)
Anesthesia Evaluation  Patient identified by MRN, date of birth, ID band Patient awake    Reviewed: Allergy & Precautions, H&P , NPO status , Patient's Chart, lab work & pertinent test results  Airway Mallampati: II TM Distance: >3 FB Neck ROM: Full    Dental  (+) Teeth Intact and Dental Advisory Given   Pulmonary former smoker,  breath sounds clear to auscultation        Cardiovascular hypertension, Pt. on medications +CHF + dysrhythmias Atrial Fibrillation Rhythm:Irregular Rate:Tachycardia     Neuro/Psych    GI/Hepatic   Endo/Other    Renal/GU      Musculoskeletal   Abdominal   Peds  Hematology   Anesthesia Other Findings   Reproductive/Obstetrics                          Anesthesia Physical Anesthesia Plan  ASA: III  Anesthesia Plan: General   Post-op Pain Management:    Induction: Intravenous  Airway Management Planned: Mask  Additional Equipment:   Intra-op Plan:   Post-operative Plan:   Informed Consent: I have reviewed the patients History and Physical, chart, labs and discussed the procedure including the risks, benefits and alternatives for the proposed anesthesia with the patient or authorized representative who has indicated his/her understanding and acceptance.   Dental advisory given  Plan Discussed with: Anesthesiologist  Anesthesia Plan Comments:         Anesthesia Quick Evaluation

## 2012-07-19 NOTE — Transfer of Care (Signed)
Immediate Anesthesia Transfer of Care Note  Patient: Howard Pearson  Procedure(s) Performed: Procedure(s) (LRB) with comments: CARDIOVERSION (N/A) - h/p in file drawer  Patient Location: Endoscopy Unit  Anesthesia Type: General  Level of Consciousness: awake, alert  and oriented  Airway & Oxygen Therapy: Patient Spontanous Breathing and Patient connected to nasal cannula oxygen  Post-op Assessment: Report given to PACU RN and Post -op Vital signs reviewed and stable  Post vital signs: Reviewed  Complications: No apparent anesthesia complications

## 2012-07-20 ENCOUNTER — Encounter (HOSPITAL_COMMUNITY): Payer: Self-pay | Admitting: Interventional Cardiology

## 2012-11-29 ENCOUNTER — Ambulatory Visit: Payer: No Typology Code available for payment source | Admitting: Internal Medicine

## 2012-12-15 ENCOUNTER — Other Ambulatory Visit: Payer: Self-pay

## 2013-06-04 ENCOUNTER — Encounter: Payer: Self-pay | Admitting: *Deleted

## 2013-06-05 ENCOUNTER — Encounter: Payer: Self-pay | Admitting: Internal Medicine

## 2013-06-05 ENCOUNTER — Other Ambulatory Visit: Payer: Self-pay

## 2013-06-05 ENCOUNTER — Ambulatory Visit (INDEPENDENT_AMBULATORY_CARE_PROVIDER_SITE_OTHER): Payer: Medicare Other | Admitting: Internal Medicine

## 2013-06-05 VITALS — BP 125/78 | HR 59 | Ht 71.0 in | Wt 211.4 lb

## 2013-06-05 DIAGNOSIS — I10 Essential (primary) hypertension: Secondary | ICD-10-CM

## 2013-06-05 DIAGNOSIS — I4891 Unspecified atrial fibrillation: Secondary | ICD-10-CM

## 2013-06-05 DIAGNOSIS — I4892 Unspecified atrial flutter: Secondary | ICD-10-CM

## 2013-06-05 NOTE — Patient Instructions (Addendum)
Your physician recommends that you schedule a follow-up appointment as needed  

## 2013-06-09 ENCOUNTER — Encounter: Payer: Self-pay | Admitting: Internal Medicine

## 2013-06-09 DIAGNOSIS — I10 Essential (primary) hypertension: Secondary | ICD-10-CM | POA: Insufficient documentation

## 2013-06-09 NOTE — Progress Notes (Signed)
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  He presented 6/13 with shortness of breath and was found to be in atrial fibrillation.  He reports decreased exercise tolerance at that time.  He was placed on amiodarone and underwent cardioversion 07/2012.  He did well until 12/13 when he returned with typical appearing atrial flutter.  This spontaneously terminated.  He stopped amiodarone 5/14.  He presented 05/15/13 again with atrial flutter.  He was mostly asymptomatic at the time.  He was initially diagnosed with atrial flutter in 2010.  He presents today for EP consultation.  He is in sinus rhythm again today.  Today, he denies symptoms of palpitations, chest pain, shortness of breath, orthopnea, PND, lower extremity edema, dizziness, presyncope, syncope, or neurologic sequela. The patient is tolerating medications without difficulties and is otherwise without complaint today.   Past Medical History  Diagnosis Date  . Hypertension   . Atrial fibrillation 03/2012  . Colon polyps   . Hyperlipemia   . CHF (congestive heart failure)   . Atrial flutter 2010   Past Surgical History  Procedure Laterality Date  . Finger tendon repair    . Cholecystectomy  06/06/2012    Procedure: LAPAROSCOPIC CHOLECYSTECTOMY WITH INTRAOPERATIVE CHOLANGIOGRAM;  Surgeon: Kandis Cocking, MD;  Location: [facility] OR;  Service: General;  Laterality: N/A;  . Tooth extraction    . Cardioversion  07/19/2012    Procedure: CARDIOVERSION;  Surgeon: Lesleigh Noe, MD;  Location: [facility] ENDOSCOPY;  Service: Cardiovascular;  Laterality: N/A;  h/p in file drawer    Current Outpatient Prescriptions  Medication Sig Dispense Refill  . acetaminophen (TYLENOL) 325 MG tablet Take 650 mg by mouth every 6 (six) hours as needed for pain.      Marland Kitchen allopurinol (ZYLOPRIM) 300  MG tablet Take 300 mg by mouth daily.      Marland Kitchen diltiazem (CARDIZEM CD) 240 MG 24 hr capsule Take 240 mg by mouth daily.      Marland Kitchen HYDROcodone-acetaminophen (VICODIN) 5-500 MG per tablet Take 1-2 tablets by mouth every 6 (six) hours as needed. For pain      . Multiple Vitamin (MULTIVITAMIN WITH MINERALS) TABS Take 1 tablet by mouth daily.      . pravastatin (PRAVACHOL) 40 MG tablet Take 40 mg by mouth daily.      Marland Kitchen warfarin (COUMADIN) 5 MG tablet Take 5 mg by mouth daily. The patient takes 1 tablet (5 mg) daily EXCEPT for 0.5 tablet (2.5 mg) on Wed/Fri/Sat       No current facility-administered medications for this visit.    Allergies  Allergen Reactions  . Penicillins     unknown    History   Social History  . Marital Status: Married    Spouse Name: N/A    Number of Children: N/A  . Years of Education: N/A   Occupational History  . Not on file.   Social History Main Topics  . Smoking status: Former Smoker -- 1.00 packs/day    Quit date: 06/03/1988  . Smokeless tobacco: Not on file  . Alcohol Use: No  . Drug Use: No  . Sexually Active:    Other Topics Concern  . Not on file   Social History Narrative  . No narrative on file    Family History  Problem Relation Age of Onset  .  Heart attack Father   . Stroke Mother   . Cancer Maternal Grandmother     ROS- All systems are reviewed and negative except as per the HPI above  Physical Exam: Filed Vitals:   06/05/13 1151  BP: 125/78  Pulse: 59  Height: 5\' 11"  (1.803 m)  Weight: 211 lb 6.4 oz (95.89 kg)    GEN- The patient is well appearing, alert and oriented x 3 today.   Head- normocephalic, atraumatic Eyes-  Sclera clear, conjunctiva pink Ears- hearing intact Oropharynx- clear Neck- supple, no JVP Lymph- no cervical lymphadenopathy Lungs- Clear to ausculation bilaterally, normal work of breathing Heart- Regular rate and rhythm, no murmurs, rubs or gallops, PMI not laterally displaced GI- soft, NT, ND, +  BS Extremities- no clubbing, cyanosis, or edema MS- no significant deformity or atrophy Skin- no rash or lesion Psych- euthymic mood, full affect Neuro- strength and sensation are intact  EKG today reveals sinus rhythm 57 bpm, nonspecific ST/T changes  Records from Dr Lonn Georgia office are reviewed at length.  This includes an ekg from 05/15/13 which confirms typical atrial flutter  Assessment and Plan:  1. The patient has both persistent atrial fibrillation and typical appearing atrial flutter.  He is mostly asymptomatic presently with his arrhythmias.  He is appropriately anticoagulated with coumadin. Therapeutic strategies for afib and atrial flutter including medicine and ablation were discussed in detail with the patient today. Risk, benefits, and alternatives to EP study and radiofrequency ablation for afib were also discussed in detail today.  As he would require ongoing anticoagulation for afib (CHADS2VASC score of 2), I am not sure that he would receive clinic benefit from atrial flutter ablation as he has been asymptomatic.   I think that at this point, I would not restart AAD therapy.  He should be followed clinically.  If he develops symptoms with afib/ atrial flutter or if he has difficult to control ventricular rates with subsequent return of CHF, then I would favor a combined afib/ atrial flutter ablation.  Today, he is clear that he does not wish to proceed with ablation, but may be more likely to do so if he becomes symptomatic or has further difficulty in the future.  2. HTN Stable No change required today  He will follow with Dr Katrinka Blazing and I will see as needed going forward.

## 2013-06-24 ENCOUNTER — Ambulatory Visit: Payer: Self-pay | Admitting: Internal Medicine

## 2013-08-12 ENCOUNTER — Ambulatory Visit (INDEPENDENT_AMBULATORY_CARE_PROVIDER_SITE_OTHER): Payer: Medicare Other | Admitting: Pharmacist

## 2013-08-12 DIAGNOSIS — I4891 Unspecified atrial fibrillation: Secondary | ICD-10-CM

## 2013-08-12 DIAGNOSIS — I482 Chronic atrial fibrillation, unspecified: Secondary | ICD-10-CM | POA: Insufficient documentation

## 2013-08-12 LAB — POCT INR: INR: 2.1

## 2013-09-05 ENCOUNTER — Other Ambulatory Visit: Payer: Self-pay

## 2013-09-23 ENCOUNTER — Ambulatory Visit (INDEPENDENT_AMBULATORY_CARE_PROVIDER_SITE_OTHER): Payer: Medicare Other | Admitting: *Deleted

## 2013-09-23 DIAGNOSIS — I4891 Unspecified atrial fibrillation: Secondary | ICD-10-CM

## 2013-09-23 LAB — POCT INR: INR: 1.7

## 2013-09-25 ENCOUNTER — Other Ambulatory Visit: Payer: Self-pay

## 2013-09-25 MED ORDER — DILTIAZEM HCL ER 180 MG PO CP24: 180.0000 mg | ORAL_CAPSULE | Freq: Every day | ORAL | Status: DC

## 2013-10-08 ENCOUNTER — Encounter: Payer: Self-pay | Admitting: Interventional Cardiology

## 2013-10-09 ENCOUNTER — Ambulatory Visit (INDEPENDENT_AMBULATORY_CARE_PROVIDER_SITE_OTHER): Payer: Medicare Other | Admitting: Interventional Cardiology

## 2013-10-09 ENCOUNTER — Ambulatory Visit (INDEPENDENT_AMBULATORY_CARE_PROVIDER_SITE_OTHER): Payer: Medicare Other | Admitting: Pharmacist

## 2013-10-09 ENCOUNTER — Encounter: Payer: Self-pay | Admitting: Interventional Cardiology

## 2013-10-09 VITALS — BP 132/80 | HR 56 | Ht 71.0 in | Wt 212.0 lb

## 2013-10-09 DIAGNOSIS — Z7901 Long term (current) use of anticoagulants: Secondary | ICD-10-CM

## 2013-10-09 DIAGNOSIS — I10 Essential (primary) hypertension: Secondary | ICD-10-CM

## 2013-10-09 DIAGNOSIS — I4891 Unspecified atrial fibrillation: Secondary | ICD-10-CM

## 2013-10-09 LAB — POCT INR: INR: 1.9

## 2013-10-09 NOTE — Progress Notes (Signed)
Patient ID: Howard Pearson, male   DOB: February 10, 1946, 67 y.o.   MRN: [id number]    [address], [address] [place], [place]  [address] Phone: [phone] Fax:  [phone]  Date:  10/09/2013   ID:  Howard Pearson, DOB December 30, 1945, MRN [id number]  PCP:  Lolita Patella, MD   ASSESSMENT:  1. Paroxysmal atrial fibrillation/flutter 2. Chronic anticoagulation therapy without complications on Coumadin 3. Hypertension  PLAN:  1. Patient is doing well. Relatively asymptomatic. No change in therapy is indicated. 2. Clinical followup in 6-9 months.   SUBJECTIVE: Howard Pearson is a 67 y.o. male who was seen by electrophysiology and the decision for ablation was to treat conservatively with medications. The patient has done well. He denies syncope. No significant episodes of prolonged palpitation. No neurological complaints. No bleeding on Coumadin.   Wt Readings from Last 3 Encounters:  10/09/13 212 lb (96.163 kg)  06/05/13 211 lb 6.4 oz (95.89 kg)  07/19/12 217 lb (98.431 kg)     Past Medical History  Diagnosis Date  . Hypertension   . Atrial fibrillation 03/2012  . Colon polyps   . Hyperlipemia   . CHF (congestive heart failure)   . Atrial flutter 2010    Current Outpatient Prescriptions  Medication Sig Dispense Refill  . acetaminophen (TYLENOL) 325 MG tablet Take 650 mg by mouth every 6 (six) hours as needed for pain.      Marland Kitchen allopurinol (ZYLOPRIM) 300 MG tablet Take 300 mg by mouth daily.      Marland Kitchen diltiazem (DILT-XR) 180 MG 24 hr capsule Take 1 capsule (180 mg total) by mouth daily.  30 capsule  6  . HYDROcodone-acetaminophen (VICODIN) 5-500 MG per tablet Take 1-2 tablets by mouth every 6 (six) hours as needed. For pain      . Multiple Vitamin (MULTIVITAMIN WITH MINERALS) TABS Take 1 tablet by mouth daily.      . pravastatin (PRAVACHOL) 40 MG tablet Take 40 mg by mouth daily.      Marland Kitchen warfarin (COUMADIN) 5 MG tablet Take 5 mg by mouth daily. The patient takes 1 tablet (5  mg) daily EXCEPT for 0.5 tablet (2.5 mg) on Wed/Fri/Sat       No current facility-administered medications for this visit.    Allergies:    Allergies  Allergen Reactions  . Penicillins     unknown    Social History:  The patient  reports that he quit smoking about 25 years ago. He does not have any smokeless tobacco history on file. He reports that he does not drink alcohol or use illicit drugs.   ROS:  Please see the history of present illness.   Denies angina. No edema. Denies syncope.   All other systems reviewed and negative.   OBJECTIVE: VS:  BP 132/80  Pulse 56  Ht 5\' 11"  (1.803 m)  Wt 212 lb (96.163 kg)  BMI 29.58 kg/m2  SpO2 99% Well nourished, well developed, in no acute distress, healthy HEENT: normal Neck: JVD flat. Carotid bruit absent  Cardiac:  normal S1, S2; RRR; no murmur Lungs:  clear to auscultation bilaterally, no wheezing, rhonchi or rales Abd: soft, nontender, no hepatomegaly Ext: Edema absent. Pulses 2+ Skin: warm and dry Neuro:  CNs 2-12 intact, no focal abnormalities noted  EKG:  Not performed       Signed, Darci Needle III, MD 10/09/2013 3:22 PM

## 2013-10-09 NOTE — Patient Instructions (Signed)
Your physician recommends that you continue on your current medications as directed. Please refer to the Current Medication list given to you today.  Your physician wants you to follow-up in: 1 year. You will receive a reminder letter in the mail two months in advance. If you don't receive a letter, please call our office to schedule the follow-up appointment.  

## 2013-10-30 ENCOUNTER — Ambulatory Visit (INDEPENDENT_AMBULATORY_CARE_PROVIDER_SITE_OTHER): Payer: Medicare Other | Admitting: Pharmacist

## 2013-10-30 DIAGNOSIS — I4891 Unspecified atrial fibrillation: Secondary | ICD-10-CM

## 2013-11-27 ENCOUNTER — Ambulatory Visit (INDEPENDENT_AMBULATORY_CARE_PROVIDER_SITE_OTHER): Payer: Medicare HMO | Admitting: *Deleted

## 2013-11-27 DIAGNOSIS — Z5181 Encounter for therapeutic drug level monitoring: Secondary | ICD-10-CM

## 2013-11-27 DIAGNOSIS — I4891 Unspecified atrial fibrillation: Secondary | ICD-10-CM

## 2013-11-27 LAB — POCT INR: INR: 2.1

## 2013-12-04 ENCOUNTER — Telehealth: Payer: Self-pay | Admitting: Interventional Cardiology

## 2013-12-04 NOTE — Telephone Encounter (Signed)
New message         Pt is having a root canal and wants to know if the medicine (cleocin 300mg ) is going to effect the medicine he is already on.

## 2013-12-05 NOTE — Telephone Encounter (Signed)
Patient states that he won't start the clindamycin until 1 week prior to 01/01/14, unless the pain gets worse and he needs to start it sooner.  I advised patient to keep his appointment with me 12/25/13 unless he has to start the clindamycin sooner.  If he starts the clindamycin before then, he agrees to call back, and will likely check an INR 5-7 days after starting ABX.  Patient agreeable to this.

## 2013-12-05 NOTE — Telephone Encounter (Signed)
returned pt call.pt sts that he has an impacted tooth and was given an Rx  for clindamycin 300mg  daily x7days.pt sts that he will need to have a root canal scheduled for 01/02/14. pt has not yet started his antibiotic, pt was told if pain starts he should go ahead and start.pt adv the antibiotic could affect his INR.I adv pt let Cablevision SystemsJeremy Smart Pharm-D know when he starts, so it can be determined when he needs to come in for a coumadin check.pt verbalized understanding

## 2013-12-25 ENCOUNTER — Ambulatory Visit (INDEPENDENT_AMBULATORY_CARE_PROVIDER_SITE_OTHER): Payer: Commercial Managed Care - HMO | Admitting: *Deleted

## 2013-12-25 DIAGNOSIS — I4891 Unspecified atrial fibrillation: Secondary | ICD-10-CM

## 2013-12-25 DIAGNOSIS — Z5181 Encounter for therapeutic drug level monitoring: Secondary | ICD-10-CM

## 2013-12-25 LAB — POCT INR: INR: 2.1

## 2014-01-22 ENCOUNTER — Ambulatory Visit (INDEPENDENT_AMBULATORY_CARE_PROVIDER_SITE_OTHER): Payer: Commercial Managed Care - HMO | Admitting: Pharmacist

## 2014-01-22 DIAGNOSIS — I4891 Unspecified atrial fibrillation: Secondary | ICD-10-CM

## 2014-01-22 DIAGNOSIS — Z5181 Encounter for therapeutic drug level monitoring: Secondary | ICD-10-CM

## 2014-01-22 LAB — POCT INR: INR: 2.1

## 2014-03-05 ENCOUNTER — Ambulatory Visit (INDEPENDENT_AMBULATORY_CARE_PROVIDER_SITE_OTHER): Payer: Medicare HMO | Admitting: Pharmacist

## 2014-03-05 DIAGNOSIS — I4891 Unspecified atrial fibrillation: Secondary | ICD-10-CM

## 2014-03-05 DIAGNOSIS — Z5181 Encounter for therapeutic drug level monitoring: Secondary | ICD-10-CM

## 2014-03-05 LAB — POCT INR: INR: 2

## 2014-03-17 ENCOUNTER — Other Ambulatory Visit: Payer: Self-pay | Admitting: Cardiology

## 2014-04-14 ENCOUNTER — Ambulatory Visit (INDEPENDENT_AMBULATORY_CARE_PROVIDER_SITE_OTHER): Payer: Medicare HMO | Admitting: Pharmacist

## 2014-04-14 DIAGNOSIS — Z5181 Encounter for therapeutic drug level monitoring: Secondary | ICD-10-CM

## 2014-04-14 DIAGNOSIS — I4891 Unspecified atrial fibrillation: Secondary | ICD-10-CM

## 2014-04-14 LAB — POCT INR: INR: 2.3

## 2014-04-21 ENCOUNTER — Other Ambulatory Visit: Payer: Self-pay | Admitting: Interventional Cardiology

## 2014-05-26 ENCOUNTER — Ambulatory Visit (INDEPENDENT_AMBULATORY_CARE_PROVIDER_SITE_OTHER): Payer: Commercial Managed Care - HMO | Admitting: Pharmacist

## 2014-05-26 DIAGNOSIS — I4891 Unspecified atrial fibrillation: Secondary | ICD-10-CM

## 2014-05-26 DIAGNOSIS — Z5181 Encounter for therapeutic drug level monitoring: Secondary | ICD-10-CM

## 2014-05-26 LAB — POCT INR: INR: 2.9

## 2014-06-16 ENCOUNTER — Other Ambulatory Visit: Payer: Self-pay | Admitting: Interventional Cardiology

## 2014-06-23 IMAGING — CT CT ABD-PELV W/ CM
2 of 5 series · 17 of 46 positions shown, 19 images · IV contrast (APPLIED)
Comparison: Ultrasound dated 06/05/2012

***ADDENDUM*** CREATED: 06/05/2012 [DATE]

Critical Value/emergent results were called by telephone at the
time of interpretation on 06/05/2012 at [DATE] p.m. to Dr. Joaquin,
who verbally acknowledged these results.
CLINICAL DATA: Fever, leukocytosis, and elevated liver enzymes.
Cholelithiasis.
CT ABDOMEN AND PELVIS WITH CONTRAST
TECHNIQUE: Multidetector CT imaging of the abdomen and pelvis was
performed following the standard protocol during bolus
administration of intravenous contrast.
Contrast: 100mL OMNIPAQUE IOHEXOL 300 MG/ML  SOLN

[Series 2: abd/pelv with 5.0 b31f st · axial · 0.81mm/px · z∈[+760,+1210]mm · 14 of 101 slices shown, 16 images]
[im 6/101  soft-tissue]
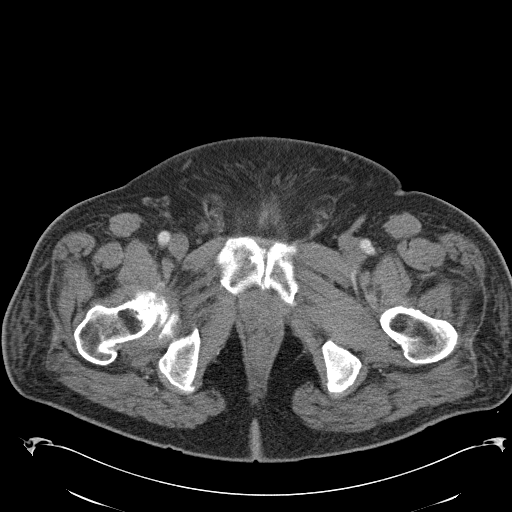
[im 6/101  bone]
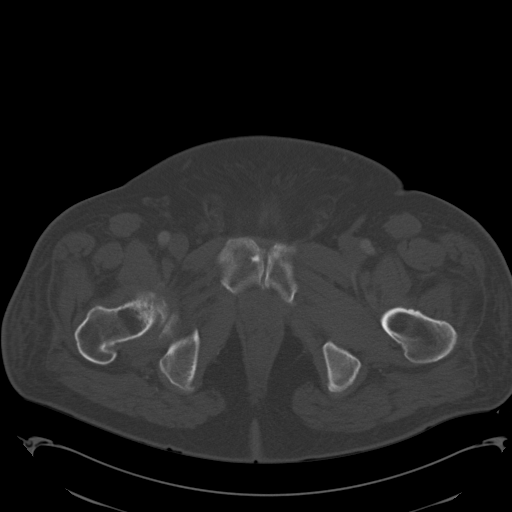
[im 16/101  soft-tissue]
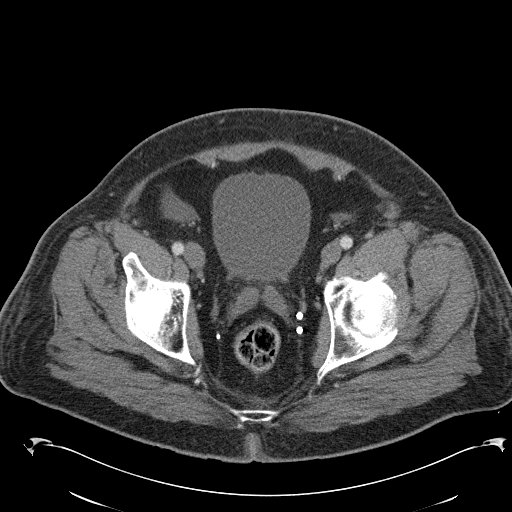
[im 21/101  soft-tissue]
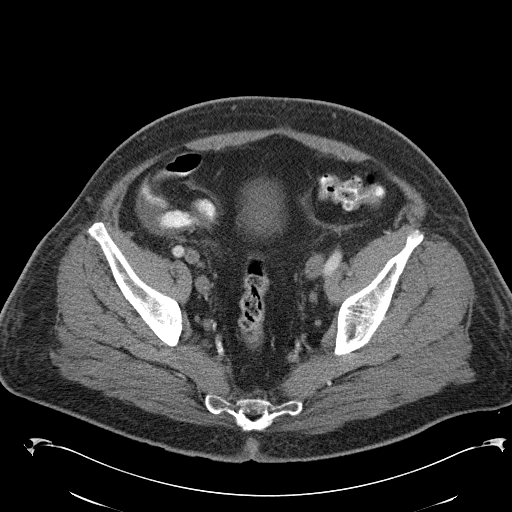
[im 26/101  soft-tissue]
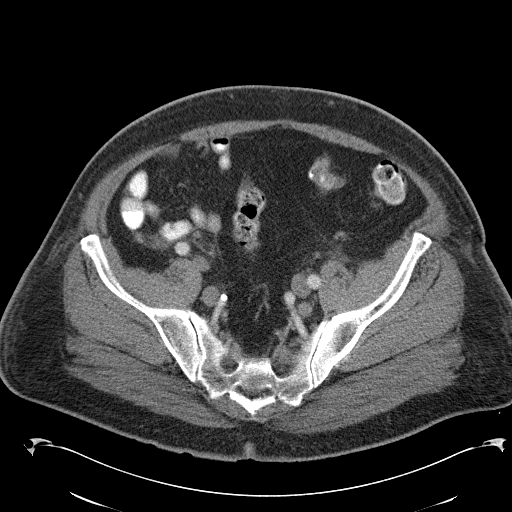
[im 36/101  soft-tissue]
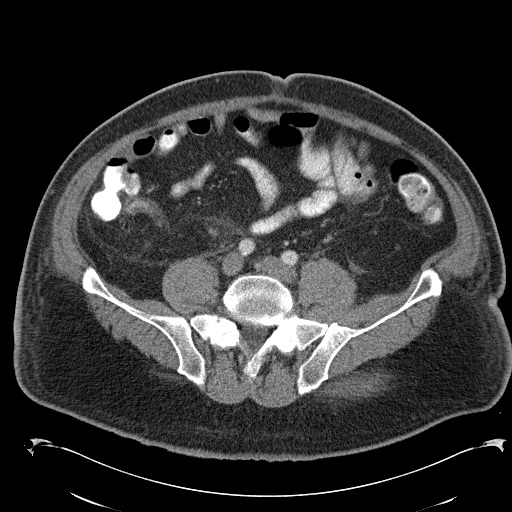
[im 41/101  soft-tissue]
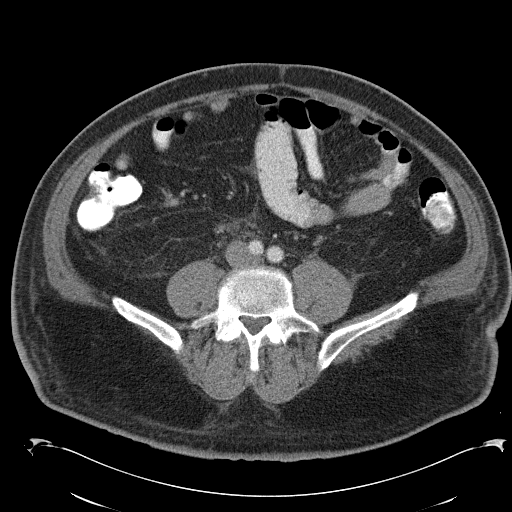
[im 46/101  soft-tissue]
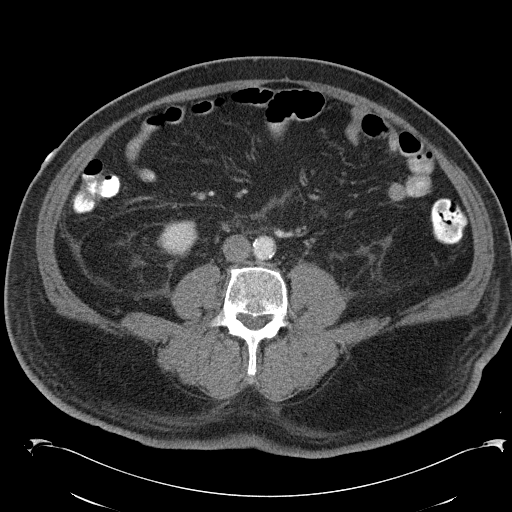
[im 56/101  soft-tissue]
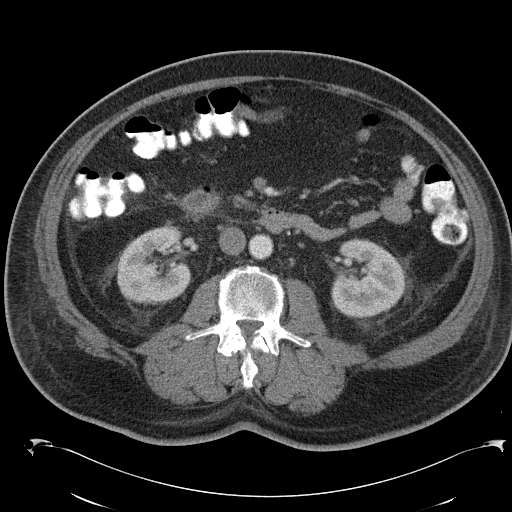
[im 61/101  soft-tissue]
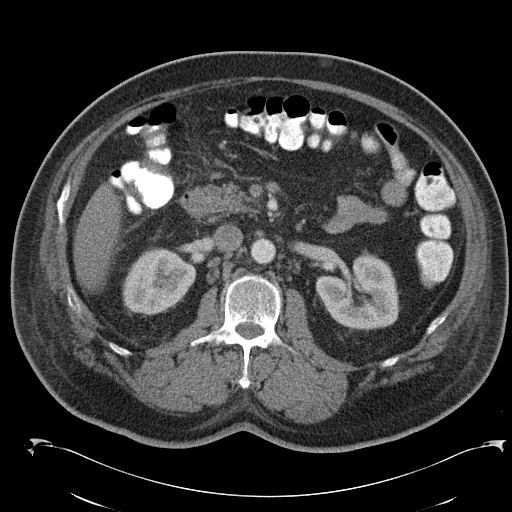
[im 61/101  bone]
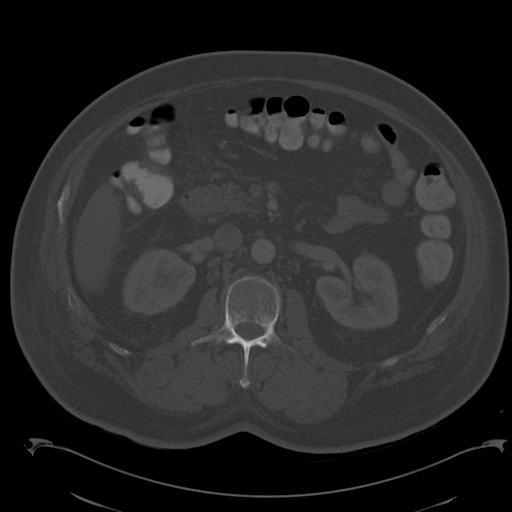
[im 66/101  soft-tissue]
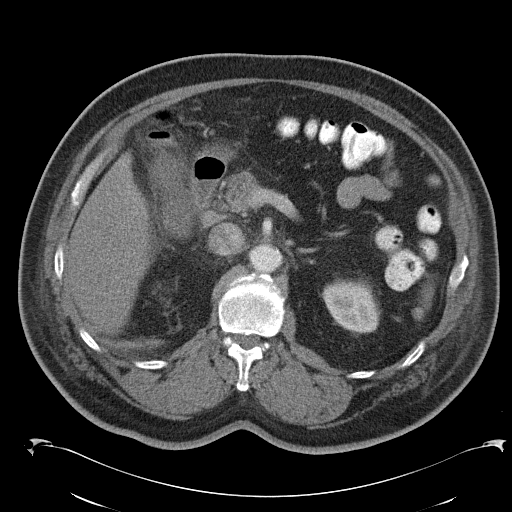
[im 76/101  soft-tissue]
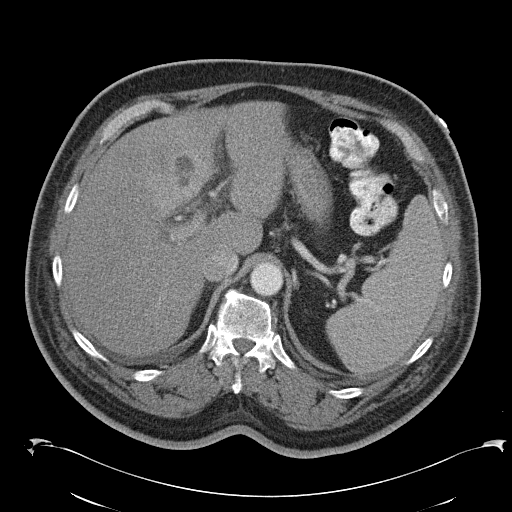
[im 81/101  soft-tissue]
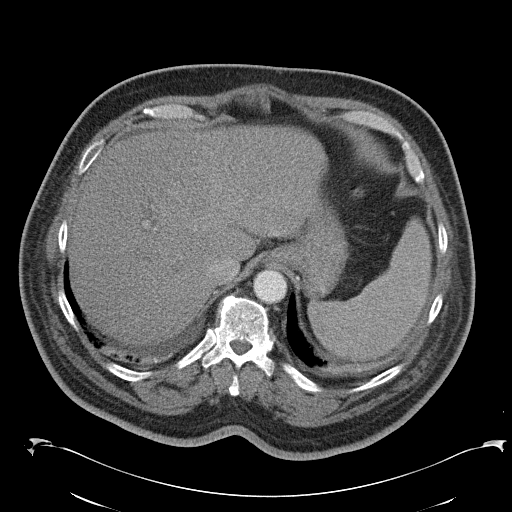
[im 86/101  soft-tissue]
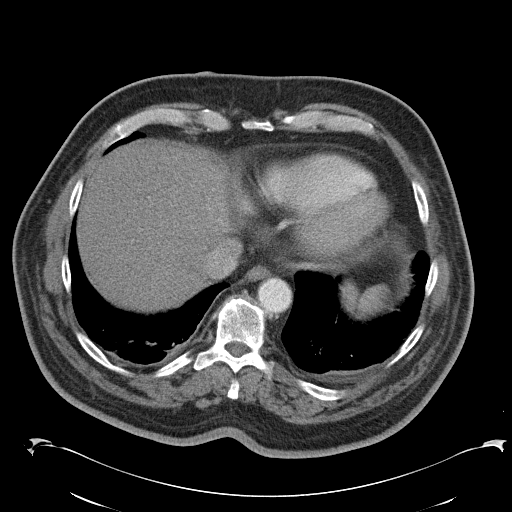
[im 96/101  soft-tissue]
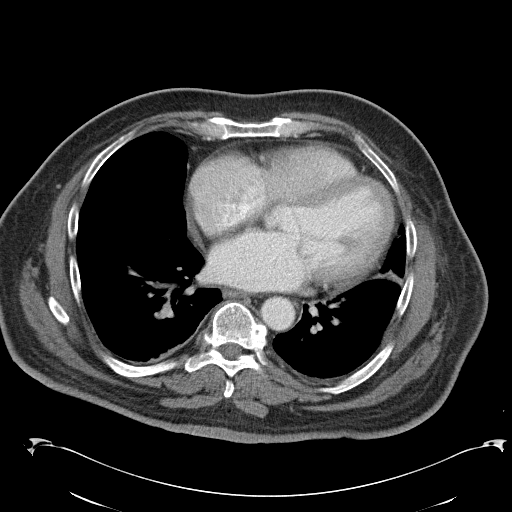

[Series 5: coronals · coronal · 0.98mm/px · 3 of 139 slices shown]
[im 47/139  soft-tissue]
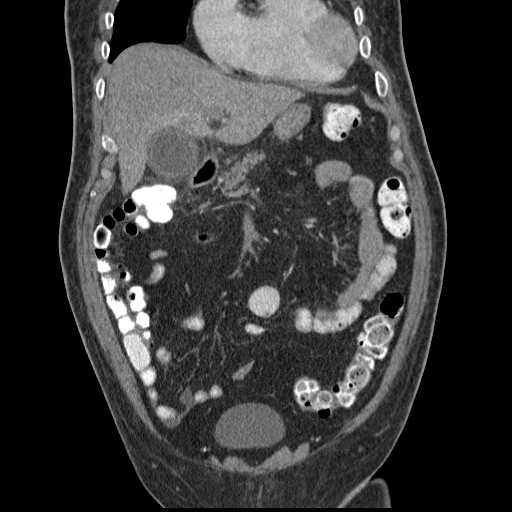
[im 62/139  soft-tissue]
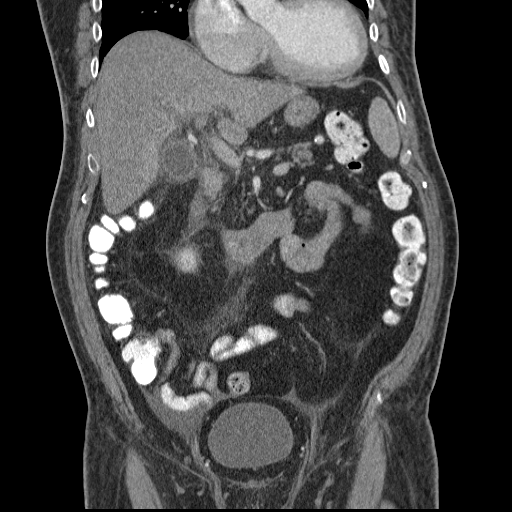
[im 77/139  soft-tissue]
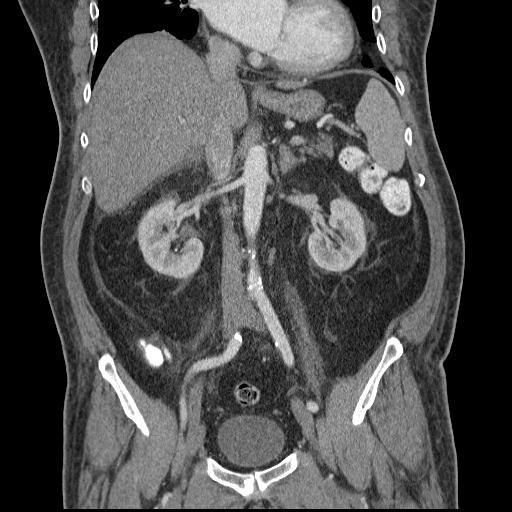

[17 of 46 positions shown; findings below may reference images not displayed]

FINDINGS: There is an area of abnormal low density with peripheral
slight enhancement in the left lobe of the liver immediately above
the gallbladder.  This measures 3.0 x 1.7 by 1.7 cm and has
consistent with a hepatic abscess.  The gallbladder is distended
with a 2 cm stone in the neck of the gallbladder with diffuse
thickening and edema of the gallbladder wall.

There are no dilated bile ducts.

There is minimal atelectasis at the lung bases with tiny effusions.

The spleen, pancreas, adrenal glands, and kidneys with straight no
acute abnormality.  There are several small stones in the lower
pole of the right kidney.

There is a small amount of ascites in the paracolic gutters.  There
are no dilated loops of large or small bowel.  Terminal ileum and
appendix are normal.  No diverticular disease.  No significant
adenopathy.
IMPRESSION: Probable acute cholecystitis with an adjacent small abscess in the
left lobe of the liver.  Small amount of ascites.

## 2014-07-09 ENCOUNTER — Ambulatory Visit (INDEPENDENT_AMBULATORY_CARE_PROVIDER_SITE_OTHER): Payer: Commercial Managed Care - HMO | Admitting: Pharmacist

## 2014-07-09 DIAGNOSIS — I4891 Unspecified atrial fibrillation: Secondary | ICD-10-CM

## 2014-07-09 DIAGNOSIS — Z5181 Encounter for therapeutic drug level monitoring: Secondary | ICD-10-CM

## 2014-07-09 LAB — POCT INR: INR: 2.3

## 2014-07-10 ENCOUNTER — Encounter: Payer: Self-pay | Admitting: Physician Assistant

## 2014-07-11 ENCOUNTER — Ambulatory Visit (INDEPENDENT_AMBULATORY_CARE_PROVIDER_SITE_OTHER): Payer: Commercial Managed Care - HMO | Admitting: Physician Assistant

## 2014-07-11 ENCOUNTER — Encounter: Payer: Self-pay | Admitting: Physician Assistant

## 2014-07-11 VITALS — BP 126/80 | HR 99 | Ht 71.0 in | Wt 220.4 lb

## 2014-07-11 DIAGNOSIS — R609 Edema, unspecified: Secondary | ICD-10-CM

## 2014-07-11 DIAGNOSIS — I4892 Unspecified atrial flutter: Secondary | ICD-10-CM

## 2014-07-11 DIAGNOSIS — I10 Essential (primary) hypertension: Secondary | ICD-10-CM

## 2014-07-11 DIAGNOSIS — I480 Paroxysmal atrial fibrillation: Secondary | ICD-10-CM

## 2014-07-11 DIAGNOSIS — R600 Localized edema: Secondary | ICD-10-CM

## 2014-07-11 DIAGNOSIS — I4891 Unspecified atrial fibrillation: Secondary | ICD-10-CM | POA: Diagnosis not present

## 2014-07-11 LAB — BRAIN NATRIURETIC PEPTIDE: PRO B NATRI PEPTIDE: 139 pg/mL — AB (ref 0.0–100.0)

## 2014-07-11 LAB — BASIC METABOLIC PANEL
BUN: 16 mg/dL (ref 6–23)
CO2: 27 mEq/L (ref 19–32)
Calcium: 9.1 mg/dL (ref 8.4–10.5)
Chloride: 108 mEq/L (ref 96–112)
Creatinine, Ser: 1.1 mg/dL (ref 0.4–1.5)
GFR: 74.15 mL/min (ref >60.00)
GLUCOSE: 94 mg/dL (ref 70–99)
Potassium: 3.5 mEq/L (ref 3.5–5.1)
Sodium: 141 mEq/L (ref 135–145)

## 2014-07-11 LAB — CBC WITH DIFFERENTIAL/PLATELET
BASOS ABS: 0.1 10*3/uL (ref 0.0–0.1)
Basophils Relative: 0.8 % (ref 0.0–3.0)
EOS ABS: 0.2 10*3/uL (ref 0.0–0.7)
Eosinophils Relative: 2 % (ref 0.0–5.0)
HEMATOCRIT: 46.3 % (ref 39.0–52.0)
HEMOGLOBIN: 15.2 g/dL (ref 13.0–17.0)
LYMPHS ABS: 2.6 10*3/uL (ref 0.7–4.0)
Lymphocytes Relative: 26.8 % (ref 12.0–46.0)
MCHC: 32.9 g/dL (ref 30.0–36.0)
MCV: 87.9 fl (ref 78.0–100.0)
MONO ABS: 0.9 10*3/uL (ref 0.1–1.0)
Monocytes Relative: 9.1 % (ref 3.0–12.0)
NEUTROS ABS: 5.9 10*3/uL (ref 1.4–7.7)
Neutrophils Relative %: 61.3 % (ref 43.0–77.0)
Platelets: 194 10*3/uL (ref 150.0–400.0)
RBC: 5.27 Mil/uL (ref 4.22–5.81)
RDW: 15.2 % (ref 11.5–15.5)
WBC: 9.7 10*3/uL (ref 4.0–10.5)

## 2014-07-11 LAB — T4, FREE: Free T4: 0.86 ng/dL (ref 0.60–1.60)

## 2014-07-11 LAB — TSH: TSH: 0.31 u[IU]/mL — ABNORMAL LOW (ref 0.35–4.50)

## 2014-07-11 MED ORDER — DILTIAZEM HCL ER 240 MG PO CP24: ORAL_CAPSULE | ORAL | Status: DC

## 2014-07-11 NOTE — Progress Notes (Signed)
[address] [place], [place]  [address] Phone: [phone] Fax:  [phone]  Date:  07/11/2014   Patient ID:  Howard Pearson, DOB 1946-01-28, MRN [id number]   PCP:  Lolita Patella, MD  Cardiologist:  Dr. Katrinka Blazing  Electrophysiologist:  Seen by Dr. Johney Frame once (decision to avoid ablation for now)  History of Present Illness: Howard Pearson is a 68 y.o. male with history of PAF, paroxysmal atrial flutter, HTN, and ?diagnosis of CHF in his chart (no echo available, details unclear) who presents for clinic followup. Atrial flutter was diagnosed in 2010. His atrial fibrillation was diagnosed in 03/2012 when he had presented with decreased exercise tolerance. At that time, he was placed on amiodarone and underwent DCCV in 07/2012. He did well until he returned 09/2012 with typical appearing atrial flutter which spontaneously terminated. He stopped amio 02/2013. He presented again with atrial flutter 04/2013. He was referred to EP and by that time was in NSR. Conservative approach was chosen instead of AAD/ablation.  He comes in today noting waxing and waning mild sockline edema where his stockings hit mid-calves. There is minimal pitting right above this. No pedal edema. No recent long travel, bedrest, injury, surgery or erythema. No CP, SOB, LEE, significant recent weight changes, orthopnea, bleeding, or any other symptoms. Able to do all activities including exertional without symptoms. Compliant with meds. Most recent INRs therapeutic. He is back in AF today and was unaware of this, HR 99. His wife occasionally checks his pulse but has not done so recently. No neuro sx.  Recent Labs: No results found for requested labs within last 365 days.  Wt Readings from Last 3 Encounters:  07/11/14 220 lb 6.4 oz (99.973 kg)  10/09/13 212 lb (96.163 kg)  06/05/13 211 lb 6.4 oz (95.89 kg)     Past Medical History  Diagnosis Date  . Hypertension   . Colon polyps   . Hyperlipemia   . CHF  (congestive heart failure)   . PAF (paroxysmal atrial fibrillation) 03/2012    a. Dx 03/2012. b. Placed on amio and underwent DCCV 07/2012. Stopped amio 02/2013.  . Paroxysmal atrial flutter 2010    a. Dx 2010. b. Recurrence of flutter 09/2012 (typical appearing), 04/2013.  Marland Kitchen BPH (benign prostatic hyperplasia)   . Gout   . Gangrenous cholecystitis     a. Adm 2013 with SIRS, UTI, transaminitis, dehydration, leukocytosis, gram neg bacteremia and gangrenous cholecystitis s/p cholecystectomy.    Current Outpatient Prescriptions  Medication Sig Dispense Refill  . acetaminophen (TYLENOL) 325 MG tablet Take 650 mg by mouth every 6 (six) hours as needed for pain.      Marland Kitchen allopurinol (ZYLOPRIM) 300 MG tablet Take 300 mg by mouth daily.      Marland Kitchen DILT-XR 180 MG 24 hr capsule TAKE ONE CAPSULE BY MOUTH ONE TIME DAILY   30 capsule  1  . HYDROcodone-acetaminophen (VICODIN) 5-500 MG per tablet Take 1-2 tablets by mouth every 6 (six) hours as needed. For pain      . Multiple Vitamin (MULTIVITAMIN WITH MINERALS) TABS Take 1 tablet by mouth daily.      . pravastatin (PRAVACHOL) 40 MG tablet Take 40 mg by mouth daily.      Marland Kitchen warfarin (COUMADIN) 5 MG tablet 1 tablet on all days except 1/2 tablet on Tuesdays and Thursdays OR AS DIRECTED BY COUMADIN CLINIC  30 tablet  3   No current facility-administered medications for this visit.    Allergies:  Penicillins   Social History:  The patient  reports that he quit smoking about 26 years ago. He does not have any smokeless tobacco history on file. He reports that he does not drink alcohol or use illicit drugs.   Family History:  The patient's family history includes Cancer in his maternal grandmother; Heart attack in his father; Stroke in his mother.   ROS:  Please see the history of present illness.  All other systems reviewed and negative.   PHYSICAL EXAM:  VS:  BP 126/80  Pulse 99  Ht  (1.803 m)  Wt 220 lb 6.4 oz (99.973 kg)  BMI 30.75 kg/m2 Well  nourished, well developed WM, in no acute distress, well appearing HEENT: normal Neck: no JVD Cardiac:  normal S1, S2; irregular, borderline elevated HR; no murmur Lungs:  clear to auscultation bilaterally, no wheezing, rhonchi or rales Abd: soft, nontender, no hepatomegaly Ext: trace sockline edema where his stockings hit mid-calves. There is minimal pitting right above this. No pedal edema. No asymmetry, erythema or palpable cords. Skin: warm and dry Neuro:  moves all extremities spontaneously, no focal abnormalities noted  EKG:  Atrial fibrillation 99bpm, nonspecific ST-T changes, one PVC noted    ASSESSMENT AND PLAN:  1. Recurrence of paroxysmal atrial fibrillation - he is unaware of any palpitations or CP/dyspnea. We discussed options including increasing rate control versus DCCV since his INRs have been therapeutic. He would like to try a trial of slightly higher dose of diltiazem to  daily. I have chosen not to push higher than this due to h/o normal HRs upper 50s-60s when in sinus rhythm per wife. Continue anticoagulation. It has been a long time since any recent bloodwork thus will update CBC, BMET to exclude any abnormalities as well as thyroid function. Check 2D echocardiogram. If his EF is down or AF persists, we may need to consider referring back to EP to reconsider antiarrhythmic therapy but will hold off on this for now given that he is not bothered by his AF. His wife will be assessing his pulse more regularly at home and will let us know if this continues. 2. Mild pedal edema - as above, check echo to assess LV function. Check pBNP. May be a mild component of diastolic CHF but will await labs before acting further. I favored increasing rate control for AF over diuretic therapy given normotensive BP. He does eat quite a bit of salt and we talked about reducing this in his diet.  3. H/o paroxysmal atrial flutter - not currently present. 4. HTN - controlled, follow on higher dose  of diltiazem.  Dispo: F/u 1 month with PA/NP or Dr. Katrinka Blazing. He has f/u with Dr. Katrinka Blazing in December.  Signed, Howard Spies, PA-C  07/11/2014 10:01 AM

## 2014-07-11 NOTE — Patient Instructions (Signed)
Your physician has recommended you make the following change in your medication:  1. INCREASE DILTIAZEM 240 MG 1 TABLET DAILY   Your physician has requested that you have an echocardiogram. Echocardiography is a painless test that uses sound waves to create images of your heart. It provides your doctor with information about the size and shape of your heart and how well your heart's chambers and valves are working. This procedure takes approximately one hour. There are no restrictions for this procedure.   Your physician recommends that you have lab work TODAY: BMET,CBC,TSH,BNP,FREET4  Your physician recommends that you schedule a follow-up appointment in: 1 MONTH WITH DR. Katrinka Blazing OR NP/PA

## 2014-07-14 ENCOUNTER — Telehealth: Payer: Self-pay | Admitting: Nurse Practitioner

## 2014-07-14 DIAGNOSIS — I480 Paroxysmal atrial fibrillation: Secondary | ICD-10-CM

## 2014-07-14 NOTE — Telephone Encounter (Signed)
Reviewed lab results and plan of care with patient who verbalized understanding and agreement.   Patient scheduled for repeat lab studies 9/21, same day as echo per patient request.  Orders in epic

## 2014-07-21 ENCOUNTER — Other Ambulatory Visit: Payer: Self-pay | Admitting: Nurse Practitioner

## 2014-07-21 ENCOUNTER — Ambulatory Visit (HOSPITAL_COMMUNITY): Payer: Medicare HMO | Attending: Physician Assistant | Admitting: Cardiology

## 2014-07-21 ENCOUNTER — Other Ambulatory Visit (INDEPENDENT_AMBULATORY_CARE_PROVIDER_SITE_OTHER): Payer: Commercial Managed Care - HMO

## 2014-07-21 DIAGNOSIS — I4891 Unspecified atrial fibrillation: Secondary | ICD-10-CM | POA: Diagnosis not present

## 2014-07-21 DIAGNOSIS — I10 Essential (primary) hypertension: Secondary | ICD-10-CM | POA: Diagnosis not present

## 2014-07-21 DIAGNOSIS — E785 Hyperlipidemia, unspecified: Secondary | ICD-10-CM | POA: Insufficient documentation

## 2014-07-21 DIAGNOSIS — I480 Paroxysmal atrial fibrillation: Secondary | ICD-10-CM

## 2014-07-21 NOTE — Progress Notes (Signed)
Echo performed. 

## 2014-07-22 ENCOUNTER — Other Ambulatory Visit: Payer: Commercial Managed Care - HMO

## 2014-07-22 DIAGNOSIS — I480 Paroxysmal atrial fibrillation: Secondary | ICD-10-CM

## 2014-07-22 LAB — T3: T3 TOTAL: 60.9 ng/dL — AB (ref 80.0–204.0)

## 2014-07-22 LAB — T3, FREE: T3, Free: 2.4 pg/mL (ref 2.3–4.2)

## 2014-07-22 LAB — T4, FREE: Free T4: 0.82 ng/dL (ref 0.60–1.60)

## 2014-07-22 LAB — TSH: TSH: 1.02 u[IU]/mL (ref 0.35–4.50)

## 2014-07-23 ENCOUNTER — Encounter (HOSPITAL_COMMUNITY): Payer: Self-pay | Admitting: Emergency Medicine

## 2014-07-23 ENCOUNTER — Observation Stay (HOSPITAL_COMMUNITY)
Admission: EM | Admit: 2014-07-23 | Discharge: 2014-07-24 | Disposition: A | Discharge status: Home / Self Care | Payer: Medicare HMO | Attending: Internal Medicine | Admitting: Internal Medicine

## 2014-07-23 ENCOUNTER — Emergency Department (HOSPITAL_COMMUNITY): Payer: Medicare HMO

## 2014-07-23 DIAGNOSIS — I4891 Unspecified atrial fibrillation: Secondary | ICD-10-CM

## 2014-07-23 DIAGNOSIS — K810 Acute cholecystitis: Secondary | ICD-10-CM | POA: Diagnosis not present

## 2014-07-23 DIAGNOSIS — Z7901 Long term (current) use of anticoagulants: Secondary | ICD-10-CM

## 2014-07-23 DIAGNOSIS — Z860100 Personal history of colon polyps, unspecified: Secondary | ICD-10-CM | POA: Insufficient documentation

## 2014-07-23 DIAGNOSIS — Z9089 Acquired absence of other organs: Secondary | ICD-10-CM | POA: Insufficient documentation

## 2014-07-23 DIAGNOSIS — Z87891 Personal history of nicotine dependence: Secondary | ICD-10-CM | POA: Diagnosis not present

## 2014-07-23 DIAGNOSIS — M109 Gout, unspecified: Secondary | ICD-10-CM | POA: Insufficient documentation

## 2014-07-23 DIAGNOSIS — Z79899 Other long term (current) drug therapy: Secondary | ICD-10-CM | POA: Diagnosis not present

## 2014-07-23 DIAGNOSIS — R112 Nausea with vomiting, unspecified: Principal | ICD-10-CM

## 2014-07-23 DIAGNOSIS — N400 Enlarged prostate without lower urinary tract symptoms: Secondary | ICD-10-CM

## 2014-07-23 DIAGNOSIS — I482 Chronic atrial fibrillation, unspecified: Secondary | ICD-10-CM | POA: Diagnosis present

## 2014-07-23 DIAGNOSIS — E785 Hyperlipidemia, unspecified: Secondary | ICD-10-CM | POA: Diagnosis not present

## 2014-07-23 DIAGNOSIS — R197 Diarrhea, unspecified: Secondary | ICD-10-CM | POA: Diagnosis not present

## 2014-07-23 DIAGNOSIS — I4892 Unspecified atrial flutter: Secondary | ICD-10-CM | POA: Diagnosis not present

## 2014-07-23 DIAGNOSIS — E860 Dehydration: Secondary | ICD-10-CM | POA: Diagnosis not present

## 2014-07-23 DIAGNOSIS — Z880 Allergy status to penicillin: Secondary | ICD-10-CM | POA: Insufficient documentation

## 2014-07-23 DIAGNOSIS — I10 Essential (primary) hypertension: Secondary | ICD-10-CM

## 2014-07-23 DIAGNOSIS — R109 Unspecified abdominal pain: Secondary | ICD-10-CM | POA: Insufficient documentation

## 2014-07-23 DIAGNOSIS — D72829 Elevated white blood cell count, unspecified: Secondary | ICD-10-CM | POA: Diagnosis present

## 2014-07-23 DIAGNOSIS — R42 Dizziness and giddiness: Secondary | ICD-10-CM | POA: Diagnosis not present

## 2014-07-23 LAB — I-STAT CG4 LACTIC ACID, ED: Lactic Acid, Venous: 1.58 mmol/L (ref 0.5–2.2)

## 2014-07-23 MED ORDER — SODIUM CHLORIDE 0.9 % IV BOLUS (SEPSIS)
1000.0000 mL | Freq: Once | INTRAVENOUS | Status: AC
  Administered 2014-07-24: 1000 mL via INTRAVENOUS

## 2014-07-23 MED ORDER — DILTIAZEM HCL 25 MG/5ML IV SOLN
15.0000 mg | Freq: Once | INTRAVENOUS | Status: AC
  Administered 2014-07-24: 15 mg via INTRAVENOUS

## 2014-07-23 MED ORDER — ONDANSETRON HCL 4 MG/2ML IJ SOLN
4.0000 mg | Freq: Once | INTRAMUSCULAR | Status: DC
  Filled 2014-07-23: qty 2

## 2014-07-23 MED ORDER — DILTIAZEM HCL 100 MG IV SOLR
5.0000 mg/h | Freq: Once | INTRAVENOUS | Status: AC
  Administered 2014-07-24: 10 mg/h via INTRAVENOUS

## 2014-07-23 NOTE — ED Notes (Signed)
Patient from home, denies chest pain, but did have abdominal pain.  Patient vomited x1 upon arrival to ED.  Patient does feel better after vomiting.

## 2014-07-23 NOTE — ED Notes (Signed)
I stat lactic acid results given to Dr. Walden by B. Wesleigh Markovic, EMT 

## 2014-07-23 NOTE — ED Notes (Signed)
MD at bedside. 

## 2014-07-23 NOTE — ED Notes (Addendum)
Patient states his abdominal pain is gone since he vomited here in the ER.

## 2014-07-23 NOTE — ED Notes (Signed)
Family at bedside. 

## 2014-07-24 ENCOUNTER — Telehealth: Payer: Self-pay | Admitting: *Deleted

## 2014-07-24 DIAGNOSIS — I4891 Unspecified atrial fibrillation: Secondary | ICD-10-CM

## 2014-07-24 DIAGNOSIS — Z7901 Long term (current) use of anticoagulants: Secondary | ICD-10-CM

## 2014-07-24 DIAGNOSIS — D72829 Elevated white blood cell count, unspecified: Secondary | ICD-10-CM | POA: Diagnosis present

## 2014-07-24 DIAGNOSIS — R112 Nausea with vomiting, unspecified: Secondary | ICD-10-CM | POA: Insufficient documentation

## 2014-07-24 DIAGNOSIS — N400 Enlarged prostate without lower urinary tract symptoms: Secondary | ICD-10-CM

## 2014-07-24 DIAGNOSIS — I10 Essential (primary) hypertension: Secondary | ICD-10-CM

## 2014-07-24 DIAGNOSIS — E860 Dehydration: Secondary | ICD-10-CM | POA: Diagnosis present

## 2014-07-24 LAB — COMPREHENSIVE METABOLIC PANEL
ALK PHOS: 90 U/L (ref 39–117)
ALT: 22 U/L (ref 0–53)
ALT: 29 U/L (ref 0–53)
ANION GAP: 11 (ref 5–15)
AST: 24 U/L (ref 0–37)
AST: 32 U/L (ref 0–37)
Albumin: 3.4 g/dL — ABNORMAL LOW (ref 3.5–5.2)
Albumin: 4.4 g/dL (ref 3.5–5.2)
Alkaline Phosphatase: 73 U/L (ref 39–117)
Anion gap: 16 — ABNORMAL HIGH (ref 5–15)
BUN: 17 mg/dL (ref 6–23)
BUN: 17 mg/dL (ref 6–23)
CALCIUM: 8.6 mg/dL (ref 8.4–10.5)
CO2: 23 mEq/L (ref 19–32)
CO2: 24 mEq/L (ref 19–32)
CREATININE: 1.02 mg/dL (ref 0.50–1.35)
Calcium: 9.9 mg/dL (ref 8.4–10.5)
Chloride: 106 mEq/L (ref 96–112)
Chloride: 107 mEq/L (ref 96–112)
Creatinine, Ser: 1.07 mg/dL (ref 0.50–1.35)
GFR calc Af Amer: 84 mL/min — ABNORMAL LOW (ref >90)
GFR calc non Af Amer: 68 mL/min — ABNORMAL LOW (ref >90)
GFR calc non Af Amer: 72 mL/min — ABNORMAL LOW (ref >90)
GFR, EST AFRICAN AMERICAN: 79 mL/min — AB (ref >90)
GLUCOSE: 127 mg/dL — AB (ref 70–99)
GLUCOSE: 134 mg/dL — AB (ref 70–99)
POTASSIUM: 4.3 meq/L (ref 3.7–5.3)
Potassium: 4.2 mEq/L (ref 3.7–5.3)
Sodium: 142 mEq/L (ref 137–147)
Sodium: 145 mEq/L (ref 137–147)
TOTAL PROTEIN: 5.9 g/dL — AB (ref 6.0–8.3)
Total Bilirubin: 1.1 mg/dL (ref 0.3–1.2)
Total Bilirubin: 1.1 mg/dL (ref 0.3–1.2)
Total Protein: 7.2 g/dL (ref 6.0–8.3)

## 2014-07-24 LAB — MAGNESIUM: MAGNESIUM: 2 mg/dL (ref 1.5–2.5)

## 2014-07-24 LAB — CBC WITH DIFFERENTIAL/PLATELET
Basophils Absolute: 0 10*3/uL (ref 0.0–0.1)
Basophils Absolute: 0 10*3/uL (ref 0.0–0.1)
Basophils Relative: 0 % (ref 0–1)
Basophils Relative: 0 % (ref 0–1)
Eosinophils Absolute: 0 10*3/uL (ref 0.0–0.7)
Eosinophils Absolute: 0.1 10*3/uL (ref 0.0–0.7)
Eosinophils Relative: 0 % (ref 0–5)
Eosinophils Relative: 1 % (ref 0–5)
HCT: 44.8 % (ref 39.0–52.0)
HEMATOCRIT: 52.9 % — AB (ref 39.0–52.0)
HEMOGLOBIN: 14.8 g/dL (ref 13.0–17.0)
HEMOGLOBIN: 17.6 g/dL — AB (ref 13.0–17.0)
LYMPHS PCT: 7 % — AB (ref 12–46)
Lymphocytes Relative: 11 % — ABNORMAL LOW (ref 12–46)
Lymphs Abs: 1.3 10*3/uL (ref 0.7–4.0)
Lymphs Abs: 1.6 10*3/uL (ref 0.7–4.0)
MCH: 29 pg (ref 26.0–34.0)
MCH: 29.2 pg (ref 26.0–34.0)
MCHC: 33 g/dL (ref 30.0–36.0)
MCHC: 33.3 g/dL (ref 30.0–36.0)
MCV: 87.8 fL (ref 78.0–100.0)
MCV: 87.9 fL (ref 78.0–100.0)
MONOS PCT: 11 % (ref 3–12)
MONOS PCT: 5 % (ref 3–12)
Monocytes Absolute: 0.9 10*3/uL (ref 0.1–1.0)
Monocytes Absolute: 1.6 10*3/uL — ABNORMAL HIGH (ref 0.1–1.0)
NEUTROS ABS: 11 10*3/uL — AB (ref 1.7–7.7)
NEUTROS ABS: 15.7 10*3/uL — AB (ref 1.7–7.7)
NEUTROS PCT: 77 % (ref 43–77)
NEUTROS PCT: 88 % — AB (ref 43–77)
PLATELETS: 190 10*3/uL (ref 150–400)
Platelets: 205 10*3/uL (ref 150–400)
RBC: 5.1 MIL/uL (ref 4.22–5.81)
RBC: 6.02 MIL/uL — ABNORMAL HIGH (ref 4.22–5.81)
RDW: 14.8 % (ref 11.5–15.5)
RDW: 14.7 % (ref 11.5–15.5)
WBC: 14.3 10*3/uL — AB (ref 4.0–10.5)
WBC: 17.9 10*3/uL — AB (ref 4.0–10.5)

## 2014-07-24 LAB — URINE MICROSCOPIC-ADD ON

## 2014-07-24 LAB — LIPASE, BLOOD: LIPASE: 24 U/L (ref 11–59)

## 2014-07-24 LAB — URINALYSIS, ROUTINE W REFLEX MICROSCOPIC
BILIRUBIN URINE: NEGATIVE
Glucose, UA: NEGATIVE mg/dL
Ketones, ur: 15 mg/dL — AB
Leukocytes, UA: NEGATIVE
NITRITE: NEGATIVE
PROTEIN: NEGATIVE mg/dL
Specific Gravity, Urine: 1.018 (ref 1.005–1.030)
UROBILINOGEN UA: 0.2 mg/dL (ref 0.0–1.0)
pH: 5.5 (ref 5.0–8.0)

## 2014-07-24 LAB — TROPONIN I
Troponin I: <0.3 ng/mL (ref <0.30)
Troponin I: <0.3 ng/mL (ref <0.30)
Troponin I: <0.3 ng/mL (ref <0.30)

## 2014-07-24 LAB — PROTIME-INR
INR: 2.02 — ABNORMAL HIGH (ref 0.00–1.49)
Prothrombin Time: 22.9 seconds — ABNORMAL HIGH (ref 11.6–15.2)

## 2014-07-24 MED ORDER — ONDANSETRON HCL 4 MG/2ML IJ SOLN: 4.0000 mg | Freq: Three times a day (TID) | INTRAMUSCULAR | Status: AC | PRN

## 2014-07-24 MED ORDER — WARFARIN SODIUM 5 MG PO TABS
5.0000 mg | ORAL_TABLET | Freq: Once | ORAL | Status: AC
  Administered 2014-07-24: 5 mg via ORAL
  Filled 2014-07-24: qty 1

## 2014-07-24 MED ORDER — SODIUM CHLORIDE 0.9 % IJ SOLN: 3.0000 mL | Freq: Two times a day (BID) | INTRAMUSCULAR | Status: DC

## 2014-07-24 MED ORDER — ATORVASTATIN CALCIUM 10 MG PO TABS
10.0000 mg | ORAL_TABLET | Freq: Every day | ORAL | Status: DC
  Administered 2014-07-24: 10 mg via ORAL
  Filled 2014-07-24: qty 1

## 2014-07-24 MED ORDER — ALLOPURINOL 300 MG PO TABS
300.0000 mg | ORAL_TABLET | Freq: Every day | ORAL | Status: DC
  Administered 2014-07-24: 300 mg via ORAL
  Filled 2014-07-24: qty 1

## 2014-07-24 MED ORDER — SIMVASTATIN 20 MG PO TABS
20.0000 mg | ORAL_TABLET | Freq: Every day | ORAL | Status: DC
  Filled 2014-07-24: qty 1

## 2014-07-24 MED ORDER — WARFARIN - PHARMACIST DOSING INPATIENT: Freq: Every day | Status: DC

## 2014-07-24 MED ORDER — SODIUM CHLORIDE 0.9 % IV SOLN: 250.0000 mL | INTRAVENOUS | Status: DC | PRN

## 2014-07-24 MED ORDER — SODIUM CHLORIDE 0.9 % IV SOLN
INTRAVENOUS | Status: DC
  Filled 2014-07-24: qty 1000

## 2014-07-24 MED ORDER — SODIUM CHLORIDE 0.9 % IV SOLN: INTRAVENOUS | Status: DC

## 2014-07-24 MED ORDER — ADULT MULTIVITAMIN W/MINERALS CH
1.0000 | ORAL_TABLET | Freq: Every day | ORAL | Status: DC
  Administered 2014-07-24: 1 via ORAL
  Filled 2014-07-24: qty 1

## 2014-07-24 MED ORDER — SODIUM CHLORIDE 0.9 % IJ SOLN: 3.0000 mL | INTRAMUSCULAR | Status: DC | PRN

## 2014-07-24 MED ORDER — DILTIAZEM HCL ER BEADS 240 MG PO CP24
240.0000 mg | ORAL_CAPSULE | Freq: Every day | ORAL | Status: DC
  Administered 2014-07-24: 240 mg via ORAL
  Filled 2014-07-24 (×2): qty 1

## 2014-07-24 NOTE — ED Notes (Signed)
Admitting MD at bedside.

## 2014-07-24 NOTE — Progress Notes (Signed)
ANTICOAGULATION CONSULT NOTE - Initial Consult  Pharmacy Consult for Coumadin  Indication: atrial fibrillation  Allergies  Allergen Reactions  . Penicillins     unknown    Patient Measurements: Weight: 211 lb 11.2 oz (96.026 kg)  Vital Signs: Temp: 97.4 F (36.3 C) (09/24 0306) Temp src: Oral (09/24 0306) BP: 127/86 mmHg (09/24 0306) Pulse Rate: 98 (09/24 0306)  Labs:  Recent Labs  07/23/14 2331 07/24/14 0010  HGB 17.6*  --   HCT 52.9*  --   PLT 205  --   LABPROT  --  22.9*  INR  --  2.02*  CREATININE 1.07  --   TROPONINI <0.30  --     The CrCl is unknown because both a height and weight (above a minimum accepted value) are required for this calculation.   Medical History: Past Medical History  Diagnosis Date  . Hypertension   . Colon polyps   . Hyperlipemia   . CHF (congestive heart failure)   . PAF (paroxysmal atrial fibrillation) 03/2012    a. Dx 03/2012. b. Placed on amio and underwent DCCV 07/2012. Stopped amio 02/2013.  . Paroxysmal atrial flutter 2010    a. Dx 2010. b. Recurrence of flutter 09/2012 (typical appearing), 04/2013.  Marland Kitchen BPH (benign prostatic hyperplasia)   . Gout   . Gangrenous cholecystitis     a. Adm 2013 with SIRS, UTI, transaminitis, dehydration, leukocytosis, gram neg bacteremia and gangrenous cholecystitis s/p cholecystectomy.    Medications:  Prescriptions prior to admission  Medication Sig Dispense Refill  . allopurinol (ZYLOPRIM) 300 MG tablet Take 300 mg by mouth daily.      Marland Kitchen diltiazem (TIAZAC) 240 MG 24 hr capsule Take 240 mg by mouth daily.      . Multiple Vitamin (MULTIVITAMIN WITH MINERALS) TABS Take 1 tablet by mouth daily.      . pravastatin (PRAVACHOL) 40 MG tablet Take 40 mg by mouth at bedtime.      Marland Kitchen warfarin (COUMADIN) 5 MG tablet Take 2.5-5 mg by mouth See admin instructions. Take 0.5 tablet (2.5mg ) on Tuesday and Thursday. Take 1 tablet ( ) all other days.        Assessment: 20 YOM with h/o of Afib on Coumadin  PTA and presents with N/V and AFib with RVR. INR on admission was therapeutic at 2.02. Other labs as above. Last dose of Coumadin was on 9/22.     Home Coumadin dose: 2.5 mg on Tue/Thur; 5 mg on all other days   Goal of Therapy:  INR 2-3 Monitor platelets by anticoagulation protocol: Yes   Plan:  1) Give Coumadin 5 mg x 1 dose today 2) Monitor daily PT/INR and s/s of bleeding   Vinnie Level, PharmD.  Clinical Pharmacist Pager [phone]

## 2014-07-24 NOTE — ED Notes (Signed)
Report called  

## 2014-07-24 NOTE — H&P (Signed)
[facility] History and Physical  Howard Pearson ZOX:[id number] DOB: 06-27-1946 DOA: 07/23/2014  Referring physician: ED physician PCP: Howard Patella, MD  Specialists:   Chief Complaint: Nausea, vomiting  HPI: Howard Pearson is a 68 y.o. male with history of prostate enlargement, HTN, atrial fibrillation on Coumadin, who presents with nausea vomiting and A fib with RVR.  Patient reports that he started having nausea at about 10 AM with very mild abdominal discomfort. He does not have diarrhea. No fever, chills. No recent sick contacts. No cold-like symptoms, such as runny nose, sore throat. He does not have cough, chest pain, shortness of breath, dysuria. He vomited once without blood in the vomitus in ED. He was found to have dehydration, leukocytosis with WBC 17.9, and  A. fib with RVR in the emergency room. X-ray- abdomen findings are consistent with ileus or enterocolitis. Patient was started with Cardizem drip with improved heart rate control. He is admitted to inpatient for observation.   Review of Systems: As presented in the history of presenting illness, rest negative.  Where does patient live? Lives with wife at home. Can patient participate in ADLs? Yes  Allergy:  Allergies  Allergen Reactions  . Penicillins     unknown    Past Medical History  Diagnosis Date  . Hypertension   . Colon polyps   . Hyperlipemia   . CHF (congestive heart failure)   . PAF (paroxysmal atrial fibrillation) 03/2012    a. Dx 03/2012. b. Placed on amio and underwent DCCV 07/2012. Stopped amio 02/2013.  . Paroxysmal atrial flutter 2010    a. Dx 2010. b. Recurrence of flutter 09/2012 (typical appearing), 04/2013.  Marland Kitchen BPH (benign prostatic hyperplasia)   . Gout   . Gangrenous cholecystitis     a. Adm 2013 with SIRS, UTI, transaminitis, dehydration, leukocytosis, gram neg bacteremia and gangrenous cholecystitis s/p cholecystectomy.    Past Surgical History  Procedure Laterality Date  .  Finger tendon repair    . Cholecystectomy  06/06/2012    Procedure: LAPAROSCOPIC CHOLECYSTECTOMY WITH INTRAOPERATIVE CHOLANGIOGRAM;  Surgeon: Kandis Cocking, MD;  Location: [facility] OR;  Service: General;  Laterality: N/A;  . Tooth extraction    . Cardioversion  07/19/2012    Procedure: CARDIOVERSION;  Surgeon: Lesleigh Noe, MD;  Location: [facility] ENDOSCOPY;  Service: Cardiovascular;  Laterality: N/A;  h/p in file drawer    Social History:  reports that he quit smoking about 26 years ago. He does not have any smokeless tobacco history on file. He reports that he does not drink alcohol or use illicit drugs.  Family History:  Family History  Problem Relation Age of Onset  . Heart attack Father   . Stroke Mother   . Cancer Maternal Grandmother      Prior to Admission medications   Medication Sig Start Date End Date Taking? Authorizing Provider  allopurinol (ZYLOPRIM) 300 MG tablet Take 300 mg by mouth daily.   Yes Historical Provider, MD  diltiazem (TIAZAC) 240 MG 24 hr capsule Take 240 mg by mouth daily.   Yes Historical Provider, MD  Multiple Vitamin (MULTIVITAMIN WITH MINERALS) TABS Take 1 tablet by mouth daily.   Yes Historical Provider, MD  pravastatin (PRAVACHOL) 40 MG tablet Take 40 mg by mouth at bedtime.   Yes Historical Provider, MD  warfarin (COUMADIN) 5 MG tablet Take 2.5-5 mg by mouth See admin instructions. Take 0.5 tablet (2.5mg ) on Tuesday and Thursday. Take 1 tablet ( ) all other days.   Yes Historical  Provider, MD    Physical Exam: Filed Vitals:   07/24/14 0130 07/24/14 0145 07/24/14 0200 07/24/14 0306  BP: 129/84 132/88 127/80 127/86  Pulse: 100 92 99 98  Temp:    97.4 F (36.3 C)  TempSrc:    Oral  Resp: Weight:    96.026 kg (211 lb 11.2 oz)  SpO2: 97% 96% 96% 99%   General: Not in acute distress. Dry mucous and membrane. HEENT:       Eyes: PERRL, EOMI, no scleral icterus       ENT: No discharge from the ears and nose, no pharynx injection, no tonsillar  enlargement.        Neck: No JVD, no bruit, no mass felt. Cardiac: S1/S2, RRR, No murmurs, gallops or rubs Pulm: Good air movement bilaterally. Clear to auscultation bilaterally. No rales, wheezing, rhonchi or rubs. Abd: Soft, nondistended,  Very mild tender over the lower abdomen, no rebound pain, no organomegaly, BS present Ext: No edema. 2+DP/PT pulse bilaterally Musculoskeletal: No joint deformities, erythema, or stiffness, ROM full Skin: No rashes.  Neuro: Alert and oriented X3, cranial nerves II-XII grossly intact, muscle strength 5/5 in all extremeties, sensation to light touch intact.  Psych: Patient is not psychotic, no suicidal or hemocidal ideation.  Labs on Admission:  Basic Metabolic Panel:  Recent Labs Lab 07/23/14 2331  NA 145  K 4.3  CL 106  CO2 23  GLUCOSE 134*  BUN 17  CREATININE 1.07  CALCIUM 9.9   Liver Function Tests:  Recent Labs Lab 07/23/14 2331  AST 32  ALT 29  ALKPHOS 90  BILITOT 1.1  PROT 7.2  ALBUMIN 4.4    Recent Labs Lab 07/23/14 2331  LIPASE 24   No results found for this basename: AMMONIA,  in the last 168 hours CBC:  Recent Labs Lab 07/23/14 2331  WBC 17.9*  NEUTROABS 15.7*  HGB 17.6*  HCT 52.9*  MCV 87.9  PLT 205   Cardiac Enzymes:  Recent Labs Lab 07/23/14 2331  TROPONINI <0.30    BNP (last 3 results)  Recent Labs  07/11/14 1007  PROBNP 139.0*   CBG: No results found for this basename: GLUCAP,  in the last 168 hours  Radiological Exams on Admission: Dg Abd Acute W/chest  07/23/2014   CLINICAL DATA:  Atrial fibrillation, nausea, emesis  EXAM: ACUTE ABDOMEN SERIES (ABDOMEN 2 VIEW & CHEST 1 VIEW)  COMPARISON:  06/03/2012  FINDINGS: The heart size and vascular pattern are normal. Lungs are clear. No effusions. There is some dense material in left upper quadrant which may represent ingested material within the stomach. Clips right upper quadrant suggest prior cholecystectomy.  No evidence of free air. There are  air-fluid levels throughout the abdomen primarily in small bowel. There are also air-fluid levels in ascending and transverse colon. There are no abnormally dilated loops of bowel. Gas is seen into the rectum.  IMPRESSION: Widespread air-fluid levels with no evidence of bowel distension and with gas into the rectum. Findings appear more consistent with ileus or enterocolitis then with obstruction.   Electronically Signed   By: Esperanza Heir M.D.   On: 07/23/2014 23:54    EKG: Independently reviewed.   Assessment/Plan Principal Problem:   Nausea and vomiting Active Problems:   BPH (benign prostatic hyperplasia)   Essential hypertension   PAF (paroxysmal atrial fibrillation)   Long term (current) use of anticoagulants   Atrial fibrillation with RVR   Nausea & vomiting  Dehydration  1. nausea and vomiting and dehydration: Most likely caused by mild viral enterocolitis as evidenced on the x-ray. Patient is afebrile, nonseptic. He is most likely dehydrated given elevated hemoglobin and dry mucous and membrane. His electrolyte is okay, with normal potassium and creatinine. After IV fluid and Zofran treatment in emergency room, his symptoms subsided significantly. -  Admit to telemetry bed for observation given Afib with RVR - IVF: NS 100cc/h - Zofran for nausea  2. A Fib with RVR: Heart rate is up to 134 initially, which responded to Cardizem drip and IV fluids - continue home cardiac exam - continue IVF - continue Coumadin per pharmacy  3. hypertension: Well controlled -Continue Cardizem  DVT ppx: On Coumadin with a therapeutic INR  Code Status: Full code Family Communication: Yes, patient's wife and sone  at bed side Disposition Plan: Admit to inpatient  Lorretta Harp [facility] Pager [phone]  If 7PM-7AM, please contact night-coverage [email or web address] Password [email or web address] 07/24/2014, 3:10 AM

## 2014-07-24 NOTE — Telephone Encounter (Signed)
called pt to go over lab results while s/w pt's wife she tells me that pt is currently admitted to [facility]. Went in yesterday per wife states pt c/o felt like a belt around his belly/waist. I did tell her labs ok. I said I will let Ronie Spies, PA pt in hosp

## 2014-07-24 NOTE — Progress Notes (Signed)
Patient discharge instructions, follow-up appointments and low-sodium, low-fat, low-cholesterol dietary options reviewed with patient and spouse at bedside. Patient discharged via wheelchair to home with family.  No active patient complaints.

## 2014-07-24 NOTE — Discharge Summary (Signed)
Physician Discharge Summary  Lex Linhares VWU:[id number] DOB: 06/07/1946 DOA: 07/23/2014  PCP: Lolita Patella, MD  Admit date: 07/23/2014 Discharge date: 07/24/2014  Time spent: 65 minutes  Recommendations for Outpatient Follow-up:  1. Followup with Lolita Patella, MD in 1 week. On followup he'll need a basic metabolic profile done to followup on electrolytes and renal function. Patient also need a CBC done to followup on his leukocytosis.  Discharge Diagnoses:  Principal Problem:   Nausea and vomiting Active Problems:   Atrial fibrillation with RVR   BPH (benign prostatic hyperplasia)   Essential hypertension   PAF (paroxysmal atrial fibrillation)   Long term (current) use of anticoagulants   Dehydration   Leukocytosis, unspecified   Discharge Condition: Stable and improved  Diet recommendation: Heart healthy  Filed Weights   07/24/14 0306  Weight: 96.026 kg (211 lb 11.2 oz)    History of present illness:  Howard Pearson is a 68 y.o. male with history of prostate enlargement, HTN, atrial fibrillation on Coumadin, who presents with nausea vomiting and A fib with RVR.  Patient reports that he started having nausea at about 10 AM with very mild abdominal discomfort. He does not have diarrhea. No fever, chills. No recent sick contacts. No cold-like symptoms, such as runny nose, sore throat. He does not have cough, chest pain, shortness of breath, dysuria. He vomited once without blood in the vomitus in ED. He was found to have dehydration, leukocytosis with WBC 17.9, and A. fib with RVR in the emergency room. X-ray- abdomen findings are consistent with ileus or enterocolitis. Patient was started with Cardizem drip with improved heart rate control. He is admitted to inpatient for observation.    Hospital Course:  #1 nausea vomiting dehydration Patient presented with nausea vomiting dehydration and was admitted under observation under telemetry. Acute abdominal series  which was done was consistent with a mild viral enterocolitis. Patient was having bowel movements. Patient was placed on IV fluids, antiemetics, and bowel rest. Patient improved clinically was done there were liquid diet which she tolerated. Patient did not have any further nausea or vomiting. Patient diet was subsequently advanced to a solid diet which she tolerated. Patient be discharged home in stable and improved condition to followup with PCP as outpatient.  #2 A. fib with RVR On admission patient was noted to be in A. fib with RVR with a heart rate of 134. Was felt this was likely secondary to problem #1. Patient was hydrated with IV fluids and initially placed on a Cardizem drip. Cardizem drip was subsequently discontinued and patient resumed on his home regimen of oral Cardizem with good rate control. Patient was maintained on Coumadin for anticoagulation. Patient remained rate controlled for the rest of the hospitalization and was discharged in stable and improved condition.  #3 hypertension Remained stable throughout the hospitalization.  #4 leukocytosis Likely secondary to problem #1 reactive in nature. Acute abdominal series which was done was negative for any pulmonary infiltrate. Urinalysis was nitrite negative, leukocytes negative. Patient remained afebrile throughout the hospitalization. Patient's leukocytosis trending down. Outpatient followup.  The rest of patient's chronic medical issues remained stable throughout the hospitalization and patient be discharged in stable and improved condition.  Procedures:  Acute abdominal x-ray 07/23/2014  Consultations:  None  Discharge Exam: Filed Vitals:   07/24/14 1422  BP: 108/63  Pulse: 71  Temp: 98.7 F (37.1 C)  Resp: 19    General: nad Cardiovascular: rrr Respiratory: ctab  Discharge Instructions You were cared for by  a hospitalist during your hospital stay. If you have any questions about your discharge medications or  the care you received while you were in the hospital after you are discharged, you can call the unit and asked to speak with the hospitalist on call if the hospitalist that took care of you is not available. Once you are discharged, your primary care physician will handle any further medical issues. Please note that NO REFILLS for any discharge medications will be authorized once you are discharged, as it is imperative that you return to your primary care physician (or establish a relationship with a primary care physician if you do not have one) for your aftercare needs so that they can reassess your need for medications and monitor your lab values.  Discharge Instructions   Diet - low sodium heart healthy    Complete by:  As directed      Discharge instructions    Complete by:  As directed   Follow up with Lolita Patella, MD in 1 week.     Increase activity slowly    Complete by:  As directed           Current Discharge Medication List    CONTINUE these medications which have NOT CHANGED   Details  allopurinol (ZYLOPRIM) 300 MG tablet Take 300 mg by mouth daily.    diltiazem (TIAZAC) 240 MG 24 hr capsule Take 240 mg by mouth daily.    Multiple Vitamin (MULTIVITAMIN WITH MINERALS) TABS Take 1 tablet by mouth daily.    pravastatin (PRAVACHOL) 40 MG tablet Take 40 mg by mouth at bedtime.    warfarin (COUMADIN) 5 MG tablet Take 2.5-5 mg by mouth See admin instructions. Take 0.5 tablet (2.5mg ) on Tuesday and Thursday. Take 1 tablet ( ) all other days.       Allergies  Allergen Reactions  . Penicillins     unknown   Follow-up Information   Follow up with Lolita Patella, MD. Schedule an appointment as soon as possible for a visit in 1 week.   Specialty:  Family Medicine   Contact information:   [phone] [address] [place] [address] [phone]        The results of significant diagnostics from this hospitalization (including imaging,  microbiology, ancillary and laboratory) are listed below for reference.    Significant Diagnostic Studies: Dg Abd Acute W/chest  07/23/2014   CLINICAL DATA:  Atrial fibrillation, nausea, emesis  EXAM: ACUTE ABDOMEN SERIES (ABDOMEN 2 VIEW & CHEST 1 VIEW)  COMPARISON:  06/03/2012  FINDINGS: The heart size and vascular pattern are normal. Lungs are clear. No effusions. There is some dense material in left upper quadrant which may represent ingested material within the stomach. Clips right upper quadrant suggest prior cholecystectomy.  No evidence of free air. There are air-fluid levels throughout the abdomen primarily in small bowel. There are also air-fluid levels in ascending and transverse colon. There are no abnormally dilated loops of bowel. Gas is seen into the rectum.  IMPRESSION: Widespread air-fluid levels with no evidence of bowel distension and with gas into the rectum. Findings appear more consistent with ileus or enterocolitis then with obstruction.   Electronically Signed   By: Esperanza Heir M.D.   On: 07/23/2014 23:54    Microbiology: No results found for this or any previous visit (from the past 240 hour(s)).   Labs: Basic Metabolic Panel:  Recent Labs Lab 07/23/14 2331 07/24/14 0541 07/24/14 0945  NA 145 142  --  K 4.3 4.2  --   CL 106 107  --   CO2 23 24  --   GLUCOSE 134* 127*  --   BUN 17 17  --   CREATININE 1.07 1.02  --   CALCIUM 9.9 8.6  --   MG  --   --  2.0   Liver Function Tests:  Recent Labs Lab 07/23/14 2331 07/24/14 0541  AST 32 24  ALT 29 22  ALKPHOS 90 73  BILITOT 1.1 1.1  PROT 7.2 5.9*  ALBUMIN 4.4 3.4*    Recent Labs Lab 07/23/14 2331  LIPASE 24   No results found for this basename: AMMONIA,  in the last 168 hours CBC:  Recent Labs Lab 07/23/14 2331 07/24/14 0945  WBC 17.9* 14.3*  NEUTROABS 15.7* 11.0*  HGB 17.6* 14.8  HCT 52.9* 44.8  MCV 87.9 87.8  PLT 205 190   Cardiac Enzymes:  Recent Labs Lab 07/23/14 2331  07/24/14 0945 07/24/14 1530  TROPONINI <0.30 <0.30 <0.30   BNP: BNP (last 3 results)  Recent Labs  07/11/14 1007  PROBNP 139.0*   CBG: No results found for this basename: GLUCAP,  in the last 168 hours     Signed:  [facility] MD [facility] 07/24/2014, 7:07 PM

## 2014-07-24 NOTE — Progress Notes (Signed)
UR completed 

## 2014-07-24 NOTE — ED Provider Notes (Signed)
CSN: [id number]     Arrival date & time 07/23/14  2229 History   First MD Initiated Contact with Patient 07/23/14 2303     Chief Complaint  Patient presents with  . Atrial Fibrillation  . Nausea  . Emesis     (Consider location/radiation/quality/duration/timing/severity/associated sxs/prior Treatment) HPI Comments: 68 year old male with history of prostate enlargement, high blood pressure, atrial fibrillation on Coumadin, gallbladder removed presents with nausea vomiting and elevated heart rate. Patient felt general abdominal discomfort across the midabdomen earlier today intermittent cramping and had decreased appetite, on arrival to ER significant vomiting which improves symptoms significantly. No recent blood in the stools, patient passing gas normally, no bowel obstruction history. Currently no abdominal pain or vomiting. Patient has been taking his meds regularly. Dr. Katrinka Blazing cardiology. [place] primary Dr.  Patient is a 68 y.o. male presenting with atrial fibrillation and vomiting. The history is provided by the patient.  Atrial Fibrillation Associated symptoms include abdominal pain. Pertinent negatives include no chest pain, no headaches and no shortness of breath.  Emesis Associated symptoms: abdominal pain and diarrhea   Associated symptoms: no chills and no headaches     Past Medical History  Diagnosis Date  . Hypertension   . Colon polyps   . Hyperlipemia   . CHF (congestive heart failure)   . PAF (paroxysmal atrial fibrillation) 03/2012    a. Dx 03/2012. b. Placed on amio and underwent DCCV 07/2012. Stopped amio 02/2013.  . Paroxysmal atrial flutter 2010    a. Dx 2010. b. Recurrence of flutter 09/2012 (typical appearing), 04/2013.  Marland Kitchen BPH (benign prostatic hyperplasia)   . Gout   . Gangrenous cholecystitis     a. Adm 2013 with SIRS, UTI, transaminitis, dehydration, leukocytosis, gram neg bacteremia and gangrenous cholecystitis s/p cholecystectomy.   Past Surgical History   Procedure Laterality Date  . Finger tendon repair    . Cholecystectomy  06/06/2012    Procedure: LAPAROSCOPIC CHOLECYSTECTOMY WITH INTRAOPERATIVE CHOLANGIOGRAM;  Surgeon: Kandis Cocking, MD;  Location: [facility] OR;  Service: General;  Laterality: N/A;  . Tooth extraction    . Cardioversion  07/19/2012    Procedure: CARDIOVERSION;  Surgeon: Lesleigh Noe, MD;  Location: [facility] ENDOSCOPY;  Service: Cardiovascular;  Laterality: N/A;  h/p in file drawer   Family History  Problem Relation Age of Onset  . Heart attack Father   . Stroke Mother   . Cancer Maternal Grandmother    History  Substance Use Topics  . Smoking status: Former Smoker -- 1.00 packs/day    Quit date: 06/03/1988  . Smokeless tobacco: Not on file  . Alcohol Use: No    Review of Systems  Constitutional: Positive for appetite change. Negative for fever and chills.  HENT: Negative for congestion.   Eyes: Negative for visual disturbance.  Respiratory: Negative for shortness of breath.   Cardiovascular: Negative for chest pain and leg swelling.  Gastrointestinal: Positive for nausea, vomiting, abdominal pain and diarrhea. Negative for blood in stool.  Genitourinary: Negative for dysuria and flank pain.  Musculoskeletal: Negative for back pain, neck pain and neck stiffness.  Skin: Negative for rash.  Neurological: Positive for light-headedness. Negative for headaches.      Allergies  Penicillins  Home Medications   Prior to Admission medications   Medication Sig Start Date End Date Taking? Authorizing Provider  allopurinol (ZYLOPRIM) 300 MG tablet Take 300 mg by mouth daily.   Yes Historical Provider, MD  diltiazem (TIAZAC) 240 MG 24 hr capsule Take 240  mg by mouth daily.   Yes Historical Provider, MD  Multiple Vitamin (MULTIVITAMIN WITH MINERALS) TABS Take 1 tablet by mouth daily.   Yes Historical Provider, MD  pravastatin (PRAVACHOL) 40 MG tablet Take 40 mg by mouth at bedtime.   Yes Historical Provider, MD  warfarin  (COUMADIN) 5 MG tablet Take 2.5-5 mg by mouth See admin instructions. Take 0.5 tablet (2.5mg ) on Tuesday and Thursday. Take 1 tablet ( ) all other days.   Yes Historical Provider, MD   BP 129/98  Pulse 108  Temp(Src) 97.6 F (36.4 C) (Oral)  Resp 23  SpO2 98% Physical Exam  Nursing note and vitals reviewed. Constitutional: He is oriented to person, place, and time. He appears well-developed and well-nourished.  HENT:  Head: Normocephalic and atraumatic.  dry mucous membranes  Eyes: Conjunctivae are normal. Right eye exhibits no discharge. Left eye exhibits no discharge.  Neck: Normal range of motion. Neck supple. No tracheal deviation present.  Cardiovascular: An irregularly irregular rhythm present. Tachycardia present.   Pulmonary/Chest: Effort normal and breath sounds normal.  Abdominal: Soft. He exhibits no distension. There is no tenderness. There is no guarding.  Musculoskeletal: He exhibits no edema.  Neurological: He is alert and oriented to person, place, and time.  Skin: Skin is warm. No rash noted.  Psychiatric: He has a normal mood and affect.    ED Course  Procedures (including critical care time) Labs Review Labs Reviewed  CBC WITH DIFFERENTIAL - Abnormal; Notable for the following:    WBC 17.9 (*)    RBC 6.02 (*)    Hemoglobin 17.6 (*)    HCT 52.9 (*)    Neutrophils Relative % 88 (*)    Lymphocytes Relative 7 (*)    Neutro Abs 15.7 (*)    All other components within normal limits  COMPREHENSIVE METABOLIC PANEL - Abnormal; Notable for the following:    Glucose, Bld 134 (*)    GFR calc non Af Amer 68 (*)    GFR calc Af Amer 79 (*)    Anion gap 16 (*)    All other components within normal limits  PROTIME-INR - Abnormal; Notable for the following:    Prothrombin Time 22.9 (*)    INR 2.02 (*)    All other components within normal limits  LIPASE, BLOOD  TROPONIN I  URINALYSIS, ROUTINE W REFLEX MICROSCOPIC  I-STAT CG4 LACTIC ACID, ED    Imaging  Review Dg Abd Acute W/chest  07/23/2014   CLINICAL DATA:  Atrial fibrillation, nausea, emesis  EXAM: ACUTE ABDOMEN SERIES (ABDOMEN 2 VIEW & CHEST 1 VIEW)  COMPARISON:  06/03/2012  FINDINGS: The heart size and vascular pattern are normal. Lungs are clear. No effusions. There is some dense material in left upper quadrant which may represent ingested material within the stomach. Clips right upper quadrant suggest prior cholecystectomy.  No evidence of free air. There are air-fluid levels throughout the abdomen primarily in small bowel. There are also air-fluid levels in ascending and transverse colon. There are no abnormally dilated loops of bowel. Gas is seen into the rectum.  IMPRESSION: Widespread air-fluid levels with no evidence of bowel distension and with gas into the rectum. Findings appear more consistent with ileus or enterocolitis then with obstruction.   Electronically Signed   By: Esperanza Heir M.D.   On: 07/23/2014 23:54     EKG Interpretation None     EKG reviewed heart rate 128 Michael fibrillation, regular right bundle-branch block, similar to previous, nonspecific ST  changes.    MDM   Final diagnoses:  Atrial fibrillation with RVR  Dehydration  Non-intractable vomiting with nausea, vomiting of unspecified type   Clinically patient presented with gastritis/gastroenteritis symptoms, no abdominal pain currently. With age and abdominal pain earlier in the day differential included bowel obstruction, colitis, appendicitis, atypical cardiac, other. Patient currently has no symptoms and clinically is dehydrated. Fluid bolus given and Cardizem bolus and drip given. Heart rate improved in symptoms improved. Patient improved on recheck. Discuss case with [facility] plan for telemetry observation.  The patients results and plan were reviewed and discussed.   Any x-rays performed were personally reviewed by myself.   Differential diagnosis were considered with the presenting  HPI.  Medications  ondansetron (ZOFRAN) injection 4 mg (not administered)  0.9 %  sodium chloride infusion (not administered)  ondansetron (ZOFRAN) injection 4 mg (not administered)  sodium chloride 0.9 % bolus 1,000 mL (0 mLs Intravenous Stopped 07/24/14 0109)  diltiazem (CARDIZEM) 100 mg in dextrose 5 % 100 mL (1 mg/mL) infusion (10 mg/hr Intravenous New Bag/Given 07/24/14 0002)  diltiazem (CARDIZEM) injection 15 mg (15 mg Intravenous Bolus from Bag 07/24/14 0003)    Filed Vitals:   07/23/14 2315 07/23/14 2350 07/23/14 2358 07/24/14 0002  BP: 135/85 139/68 139/68 129/98  Pulse: 113 50  108  Temp:      TempSrc:      Resp: SpO2: 97% 99% 98% 98%    Final diagnoses:  Atrial fibrillation with RVR  Dehydration  Non-intractable vomiting with nausea, vomiting of unspecified type    Admission/ observation were discussed with the admitting physician, patient and/or family and they are comfortable with the plan.     Enid Skeens, MD 07/24/14 [phone]

## 2014-07-24 NOTE — ED Notes (Signed)
Attempted to call report

## 2014-08-11 ENCOUNTER — Ambulatory Visit: Payer: Commercial Managed Care - HMO | Admitting: Physician Assistant

## 2014-08-11 ENCOUNTER — Other Ambulatory Visit: Payer: Self-pay | Admitting: Interventional Cardiology

## 2014-08-20 ENCOUNTER — Ambulatory Visit (INDEPENDENT_AMBULATORY_CARE_PROVIDER_SITE_OTHER): Payer: Commercial Managed Care - HMO | Admitting: Pharmacist

## 2014-08-20 DIAGNOSIS — I4891 Unspecified atrial fibrillation: Secondary | ICD-10-CM

## 2014-08-20 DIAGNOSIS — I480 Paroxysmal atrial fibrillation: Secondary | ICD-10-CM

## 2014-08-20 LAB — POCT INR: INR: 2

## 2014-08-26 ENCOUNTER — Ambulatory Visit (INDEPENDENT_AMBULATORY_CARE_PROVIDER_SITE_OTHER): Payer: Commercial Managed Care - HMO | Admitting: Physician Assistant

## 2014-08-26 ENCOUNTER — Encounter: Payer: Self-pay | Admitting: Physician Assistant

## 2014-08-26 VITALS — BP 130/80 | HR 63 | Ht 71.0 in | Wt 217.0 lb

## 2014-08-26 DIAGNOSIS — I10 Essential (primary) hypertension: Secondary | ICD-10-CM

## 2014-08-26 DIAGNOSIS — R600 Localized edema: Secondary | ICD-10-CM

## 2014-08-26 DIAGNOSIS — I480 Paroxysmal atrial fibrillation: Secondary | ICD-10-CM

## 2014-08-26 NOTE — Patient Instructions (Signed)
No changes were made today.  Keep your follow up with Dr. Verdis PrimeHenry Pearson on 10/10/2014.

## 2014-08-26 NOTE — Progress Notes (Signed)
Cardiology Office Note   Date:  08/26/2014   ID:  Howard CageJames Bevilacqua, DOB 1946/04/10, MRN [id number]  PCP:  Lolita PatellaEADE,ROBERT ALEXANDER, MD  Cardiologist:  Dr. Verdis PrimeHenry Smith   Electrophysiologist:  Dr. Hillis RangeJames Allred (seen once in past)   History of Present Illness: Howard Pearson is a 68 y.o. male with a history of paroxysmal atrial fibrillation/flutter, HTN, dilated cardiomyopathy. He underwent cardioversion in 07/2012. He was on amiodarone. He has had recurrent atrial arrhythmias since then. He did see electrophysiology in the past. Conservative approach was chosen instead of antiarrhythmic drugs or ablation. He was recently seen by Ronie Spiesayna Dunn, PA-C 07/11/14. He was back in atrial fibrillation. Rate control therapy was pursued. Echo was obtained and demonstrated normal LVF with apical AK.  Mrs. Shea EvansDunn reviewed this with Dr. Katrinka BlazingSmith and no further workup was felt to be necessary.   Patient was admitted 9/23-9/24 with nausea and vomiting felt to be related to mild viral enterocolitis. He was treated with IV fluids and antiemetics and bowel rest. He developed rapid ventricular rate with his atrial fibrillation. Rate control was achieved with IV diltiazem.  He returns for follow-up.  The patient denies chest pain, shortness of breath, syncope, orthopnea, PND or significant pedal edema.    Studies:  - Echo (9/15):  Mild LVH.  EF  55% to 60%.  Apical AK, mild AI, mod MVP (anterior leaflet and the posterior leaflet), mod LAE, mild RVE, mild LAE   Recent Labs/Images:  Recent Labs  07/11/14 1007 07/21/14 1211  07/24/14 0541 07/24/14 0945  NA 141  --   < > 142  --   K 3.5  --   < > 4.2  --   BUN 16  --   < > 17  --   CREATININE 1.1  --   < > 1.02  --   ALT  --   --   < > 22  --   HGB 15.2  --   < >  --  14.8  TSH 0.31* 1.02  --   --   --   PROBNP 139.0*  --   --   --   --   < > = values in this interval not displayed.     Wt Readings from Last 3 Encounters:  08/26/14 217 lb (98.431 kg)  07/24/14  211 lb 11.2 oz (96.026 kg)  07/11/14 220 lb 6.4 oz (99.973 kg)     Past Medical History  Diagnosis Date  . Hypertension   . Colon polyps   . Hyperlipemia   . CHF (congestive heart failure)   . PAF (paroxysmal atrial fibrillation) 03/2012    a. Dx 03/2012. b. Placed on amio and underwent DCCV 07/2012. Stopped amio 02/2013.  . Paroxysmal atrial flutter 2010    a. Dx 2010. b. Recurrence of flutter 09/2012 (typical appearing), 04/2013.  Marland Kitchen. BPH (benign prostatic hyperplasia)   . Gout   . Gangrenous cholecystitis     a. Adm 2013 with SIRS, UTI, transaminitis, dehydration, leukocytosis, gram neg bacteremia and gangrenous cholecystitis s/p cholecystectomy.    Current Outpatient Prescriptions  Medication Sig Dispense Refill  . allopurinol (ZYLOPRIM) 300 MG tablet Take 300 mg by mouth daily.      Marland Kitchen. diltiazem (TIAZAC) 240 MG 24 hr capsule Take 240 mg by mouth daily.      . Multiple Vitamin (MULTIVITAMIN WITH MINERALS) TABS Take 1 tablet by mouth daily.      . pravastatin (PRAVACHOL) 40 MG tablet Take 40  mg by mouth at bedtime.      Marland Kitchen. warfarin (COUMADIN) 5 MG tablet Take as directed by anticoagulation clinic  30 tablet  3   No current facility-administered medications for this visit.     Allergies:   Penicillins   Social History:  The patient  reports that he quit smoking about 26 years ago. He does not have any smokeless tobacco history on file. He reports that he does not drink alcohol or use illicit drugs.   Family History:  The patient's family history includes Cancer in his maternal grandmother; Heart attack in his father; Stroke in his mother.   ROS:  Please see the history of present illness.      All other systems reviewed and negative.    PHYSICAL EXAM: VS:  BP 130/80  Pulse 63  Ht 5\' 11"  (1.803 m)  Wt 217 lb (98.431 kg)  BMI 30.28 kg/m2 Well nourished, well developed, in no acute distress HEENT: normal Neck: no JVD Cardiac:  normal S1, S2; irregularly irregular rhythm; no  murmur Lungs:   clear to auscultation bilaterally, no wheezing, rhonchi or rales Abd: soft, nontender, no hepatomegaly Ext: trace to 1+ bilateral LE edema Skin: warm and dry Neuro:  CNs 2-12 intact, no focal abnormalities noted  EKG:  AFib, HR 63      ASSESSMENT AND PLAN:  PAF (paroxysmal atrial fibrillation) -  This is likely chronic now.  Rate control is good. Continue Warfarin for stroke prophylaxis.  He is asymptomatic.  Would recommend continuing rate control strategy.   Essential hypertension - Controlled.   Bilateral edema of lower extremity -  This is likely related to venous insufficiency.  He has evidence of varicose veins on exam.  Keep legs elevated. No need for diuretics at this point.   Disposition:   FU with Dr. Verdis PrimeHenry Smith in December as planned.    Signed, Brynda RimScott Ahtziry Saathoff, PA-C, MHS 08/26/2014 12:17 PM    [facility] [address], [place], [place]  [address] Phone: [phone]; Fax: [phone]

## 2014-10-10 ENCOUNTER — Ambulatory Visit (INDEPENDENT_AMBULATORY_CARE_PROVIDER_SITE_OTHER): Payer: Commercial Managed Care - HMO | Admitting: *Deleted

## 2014-10-10 ENCOUNTER — Ambulatory Visit: Payer: Commercial Managed Care - HMO | Admitting: Interventional Cardiology

## 2014-10-10 DIAGNOSIS — I4891 Unspecified atrial fibrillation: Secondary | ICD-10-CM

## 2014-10-10 DIAGNOSIS — I480 Paroxysmal atrial fibrillation: Secondary | ICD-10-CM

## 2014-10-10 LAB — POCT INR: INR: 2.5

## 2014-11-20 ENCOUNTER — Encounter: Payer: Self-pay | Admitting: Interventional Cardiology

## 2014-11-20 ENCOUNTER — Ambulatory Visit (INDEPENDENT_AMBULATORY_CARE_PROVIDER_SITE_OTHER): Payer: Commercial Managed Care - HMO | Admitting: Interventional Cardiology

## 2014-11-20 ENCOUNTER — Ambulatory Visit (INDEPENDENT_AMBULATORY_CARE_PROVIDER_SITE_OTHER): Payer: Commercial Managed Care - HMO | Admitting: *Deleted

## 2014-11-20 VITALS — BP 122/80 | HR 64 | Ht 71.0 in | Wt 222.0 lb

## 2014-11-20 DIAGNOSIS — Z7901 Long term (current) use of anticoagulants: Secondary | ICD-10-CM

## 2014-11-20 DIAGNOSIS — I4891 Unspecified atrial fibrillation: Secondary | ICD-10-CM

## 2014-11-20 DIAGNOSIS — I10 Essential (primary) hypertension: Secondary | ICD-10-CM

## 2014-11-20 DIAGNOSIS — I480 Paroxysmal atrial fibrillation: Secondary | ICD-10-CM

## 2014-11-20 DIAGNOSIS — I4892 Unspecified atrial flutter: Secondary | ICD-10-CM

## 2014-11-20 LAB — POCT INR: INR: 2.5

## 2014-11-20 NOTE — Progress Notes (Signed)
Patient ID: Howard Pearson, male   DOB: 1946-04-15, 69 y.o.   MRN: [id number]    [address] [place], [place]  [address] Phone: [phone] Fax:  [phone]  Date:  11/20/2014   ID:  Howard Pearson, DOB 03-05-1946, MRN [id number]  PCP:  Lolita Patella, MD   ASSESSMENT:  1. Chronic atrial fibrillation with good rate control 2. Chronic anticoagulation without complications 3. Essential hypertension, controlled 4. Chronic combined systolic and diastolic heart failure with improvement in his systolic function to the normal range with EF 55-60% by echo done in September related to better rate control  PLAN:  1. Continue current medical regimen 2. Coumadin clinic 3. Clinical follow-up in one year   SUBJECTIVE: Howard Pearson is a 69 y.o. male who has no complaints. We spent time discussing the recent echo. He has no symptoms on his current medical regimen. He has not had blood in urine or stool. He has not had syncope. He denies orthopnea and PND.   Wt Readings from Last 3 Encounters:  11/20/14 222 lb (100.699 kg)  08/26/14 217 lb (98.431 kg)  07/24/14 211 lb 11.2 oz (96.026 kg)     Past Medical History  Diagnosis Date  . Hypertension   . Colon polyps   . Hyperlipemia   . CHF (congestive heart failure)   . PAF (paroxysmal atrial fibrillation) 03/2012    a. Dx 03/2012. b. Placed on amio and underwent DCCV 07/2012. Stopped amio 02/2013.  . Paroxysmal atrial flutter 2010    a. Dx 2010. b. Recurrence of flutter 09/2012 (typical appearing), 04/2013.  Marland Kitchen BPH (benign prostatic hyperplasia)   . Gout   . Gangrenous cholecystitis     a. Adm 2013 with SIRS, UTI, transaminitis, dehydration, leukocytosis, gram neg bacteremia and gangrenous cholecystitis s/p cholecystectomy.    Current Outpatient Prescriptions  Medication Sig Dispense Refill  . allopurinol (ZYLOPRIM) 300 MG tablet Take 300 mg by mouth daily.    Marland Kitchen diltiazem (TIAZAC) 240 MG 24 hr capsule Take 240 mg by  mouth daily.    . Multiple Vitamin (MULTIVITAMIN WITH MINERALS) TABS Take 1 tablet by mouth daily.    . pravastatin (PRAVACHOL) 40 MG tablet Take 40 mg by mouth at bedtime.    Marland Kitchen warfarin (COUMADIN) 5 MG tablet Take as directed by anticoagulation clinic 30 tablet 3   No current facility-administered medications for this visit.    Allergies:    Allergies  Allergen Reactions  . Penicillins     unknown    Social History:  The patient  reports that he quit smoking about 26 years ago. He does not have any smokeless tobacco history on file. He reports that he does not drink alcohol or use illicit drugs.   ROS:  Please see the history of present illness.   Occasional lower extremity edema if he sits too long at the computer   All other systems reviewed and negative.   OBJECTIVE: VS:  BP 122/80 mmHg  Pulse 64  Ht  (1.803 m)  Wt 222 lb (100.699 kg)  BMI 30.98 kg/m2  SpO2 94% Well nourished, well developed, in no acute distress, healthy appearing HEENT: normal Neck: JVD flat. Carotid bruit absent  Cardiac:  normal S1, S2; IIRR; no murmur Lungs:  clear to auscultation bilaterally, no wheezing, rhonchi or rales Abd: soft, nontender, no hepatomegaly Ext: Edema absent. Pulses 2+ Skin: warm and dry Neuro:  CNs 2-12 intact, no focal abnormalities noted  EKG:  Not performed  Signed, Darci NeedleHenry W. B. Errick Salts III, MD 11/20/2014 2:57 PM

## 2014-11-20 NOTE — Patient Instructions (Signed)
Your physician recommends that you continue on your current medications as directed. Please refer to the Current Medication list given to you today.  Your physician wants you to follow-up in: 1 year. You will receive a reminder letter in the mail two months in advance. If you don't receive a letter, please call our office to schedule the follow-up appointment.  

## 2014-12-23 ENCOUNTER — Telehealth: Payer: Self-pay

## 2014-12-23 MED ORDER — WARFARIN SODIUM 5 MG PO TABS: ORAL_TABLET | ORAL | Status: DC

## 2014-12-23 NOTE — Telephone Encounter (Signed)
RX done. 

## 2015-01-01 ENCOUNTER — Ambulatory Visit (INDEPENDENT_AMBULATORY_CARE_PROVIDER_SITE_OTHER): Payer: PPO | Admitting: Pharmacist

## 2015-01-01 DIAGNOSIS — I480 Paroxysmal atrial fibrillation: Secondary | ICD-10-CM

## 2015-01-01 DIAGNOSIS — I4891 Unspecified atrial fibrillation: Secondary | ICD-10-CM

## 2015-01-01 LAB — POCT INR: INR: 2.5

## 2015-02-05 ENCOUNTER — Other Ambulatory Visit: Payer: Self-pay | Admitting: Physician Assistant

## 2015-02-12 ENCOUNTER — Ambulatory Visit (INDEPENDENT_AMBULATORY_CARE_PROVIDER_SITE_OTHER): Payer: PPO | Admitting: Pharmacist

## 2015-02-12 DIAGNOSIS — I4891 Unspecified atrial fibrillation: Secondary | ICD-10-CM

## 2015-02-12 DIAGNOSIS — I480 Paroxysmal atrial fibrillation: Secondary | ICD-10-CM | POA: Diagnosis not present

## 2015-02-12 LAB — POCT INR: INR: 3.2

## 2015-03-16 ENCOUNTER — Ambulatory Visit (INDEPENDENT_AMBULATORY_CARE_PROVIDER_SITE_OTHER): Payer: PPO | Admitting: *Deleted

## 2015-03-16 DIAGNOSIS — I480 Paroxysmal atrial fibrillation: Secondary | ICD-10-CM

## 2015-03-16 DIAGNOSIS — I4891 Unspecified atrial fibrillation: Secondary | ICD-10-CM | POA: Diagnosis not present

## 2015-03-16 LAB — POCT INR: INR: 3.4

## 2015-03-31 ENCOUNTER — Ambulatory Visit (INDEPENDENT_AMBULATORY_CARE_PROVIDER_SITE_OTHER): Payer: PPO | Admitting: *Deleted

## 2015-03-31 DIAGNOSIS — I4891 Unspecified atrial fibrillation: Secondary | ICD-10-CM | POA: Diagnosis not present

## 2015-03-31 DIAGNOSIS — I480 Paroxysmal atrial fibrillation: Secondary | ICD-10-CM | POA: Diagnosis not present

## 2015-03-31 LAB — POCT INR: INR: 2.2

## 2015-04-27 ENCOUNTER — Other Ambulatory Visit: Payer: Self-pay

## 2015-04-27 ENCOUNTER — Ambulatory Visit (INDEPENDENT_AMBULATORY_CARE_PROVIDER_SITE_OTHER): Payer: PPO

## 2015-04-27 DIAGNOSIS — I4891 Unspecified atrial fibrillation: Secondary | ICD-10-CM

## 2015-04-27 DIAGNOSIS — I480 Paroxysmal atrial fibrillation: Secondary | ICD-10-CM

## 2015-04-27 LAB — POCT INR: INR: 1.9

## 2015-05-25 ENCOUNTER — Other Ambulatory Visit: Payer: Self-pay | Admitting: Interventional Cardiology

## 2015-05-25 ENCOUNTER — Ambulatory Visit (INDEPENDENT_AMBULATORY_CARE_PROVIDER_SITE_OTHER): Payer: PPO | Admitting: *Deleted

## 2015-05-25 DIAGNOSIS — I480 Paroxysmal atrial fibrillation: Secondary | ICD-10-CM | POA: Diagnosis not present

## 2015-05-25 DIAGNOSIS — I4891 Unspecified atrial fibrillation: Secondary | ICD-10-CM

## 2015-05-25 LAB — POCT INR: INR: 2.7

## 2015-05-25 NOTE — Telephone Encounter (Signed)
Coumadin refill. Thank you for your time. 

## 2015-06-22 ENCOUNTER — Ambulatory Visit (INDEPENDENT_AMBULATORY_CARE_PROVIDER_SITE_OTHER): Payer: PPO | Admitting: Pharmacist

## 2015-06-22 DIAGNOSIS — I480 Paroxysmal atrial fibrillation: Secondary | ICD-10-CM | POA: Diagnosis not present

## 2015-06-22 DIAGNOSIS — I4891 Unspecified atrial fibrillation: Secondary | ICD-10-CM | POA: Diagnosis not present

## 2015-06-22 LAB — POCT INR: INR: 2.9

## 2015-07-20 ENCOUNTER — Ambulatory Visit (INDEPENDENT_AMBULATORY_CARE_PROVIDER_SITE_OTHER): Payer: PPO | Admitting: *Deleted

## 2015-07-20 DIAGNOSIS — I4891 Unspecified atrial fibrillation: Secondary | ICD-10-CM | POA: Diagnosis not present

## 2015-07-20 DIAGNOSIS — I480 Paroxysmal atrial fibrillation: Secondary | ICD-10-CM | POA: Diagnosis not present

## 2015-07-20 LAB — POCT INR: INR: 2.7

## 2015-07-31 ENCOUNTER — Other Ambulatory Visit: Payer: Self-pay | Admitting: Family Medicine

## 2015-07-31 DIAGNOSIS — Z87891 Personal history of nicotine dependence: Secondary | ICD-10-CM

## 2015-07-31 DIAGNOSIS — Z136 Encounter for screening for cardiovascular disorders: Secondary | ICD-10-CM

## 2015-08-05 ENCOUNTER — Other Ambulatory Visit: Payer: Self-pay | Admitting: Interventional Cardiology

## 2015-08-06 ENCOUNTER — Ambulatory Visit (INDEPENDENT_AMBULATORY_CARE_PROVIDER_SITE_OTHER): Payer: PPO | Admitting: *Deleted

## 2015-08-06 DIAGNOSIS — I4891 Unspecified atrial fibrillation: Secondary | ICD-10-CM

## 2015-08-06 DIAGNOSIS — I480 Paroxysmal atrial fibrillation: Secondary | ICD-10-CM | POA: Diagnosis not present

## 2015-08-06 LAB — POCT INR: INR: 2.4

## 2015-08-11 ENCOUNTER — Ambulatory Visit
Admission: RE | Admit: 2015-08-11 | Discharge: 2015-08-11 | Disposition: A | Discharge status: Home / Self Care | Payer: PPO | Source: Ambulatory Visit | Attending: Family Medicine | Admitting: Family Medicine

## 2015-08-11 DIAGNOSIS — Z87891 Personal history of nicotine dependence: Secondary | ICD-10-CM

## 2015-08-11 DIAGNOSIS — Z136 Encounter for screening for cardiovascular disorders: Secondary | ICD-10-CM

## 2015-09-15 ENCOUNTER — Telehealth: Payer: Self-pay

## 2015-09-15 NOTE — Telephone Encounter (Signed)
Cardiac clearance placed in MR nurse fax box to be faxed to [facility]

## 2015-09-17 ENCOUNTER — Ambulatory Visit (INDEPENDENT_AMBULATORY_CARE_PROVIDER_SITE_OTHER): Payer: PPO | Admitting: *Deleted

## 2015-09-17 ENCOUNTER — Telehealth: Payer: Self-pay | Admitting: Interventional Cardiology

## 2015-09-17 DIAGNOSIS — I480 Paroxysmal atrial fibrillation: Secondary | ICD-10-CM

## 2015-09-17 DIAGNOSIS — I4891 Unspecified atrial fibrillation: Secondary | ICD-10-CM | POA: Diagnosis not present

## 2015-09-17 LAB — POCT INR: INR: 2.6

## 2015-09-17 NOTE — Telephone Encounter (Signed)
New Message  Melissa from [facility] calling to speak w/ RN concerning recent clearance sent to [facility] from Dr Katrinka BlazingSMith. Originally it stated to hold coumadin starting on 11/27, but they are calling to see if it can be held starting 11/25. Please call back and discuss.

## 2015-09-17 NOTE — Telephone Encounter (Signed)
Updated clearance form. Ok to hold 5 days prior to colonoscopy on 11/30. Pt does not need Lovenox bridge since CHADS2 score is 2. Can restart 11/30 in the PM or as directed by MD. Jasmine DecemberSharon RN in [facility] has called [facility] to communicate approval for holding 5 days starting 11/25.

## 2015-09-17 NOTE — Telephone Encounter (Signed)
Spoke with Melissa at [facility] and informed that will fax updated clearance for pt's colonoscopy and will call pt with updated information.

## 2015-09-30 ENCOUNTER — Other Ambulatory Visit: Payer: Self-pay | Admitting: Gastroenterology

## 2015-10-07 ENCOUNTER — Ambulatory Visit (INDEPENDENT_AMBULATORY_CARE_PROVIDER_SITE_OTHER): Payer: PPO | Admitting: *Deleted

## 2015-10-07 DIAGNOSIS — I480 Paroxysmal atrial fibrillation: Secondary | ICD-10-CM

## 2015-10-07 DIAGNOSIS — I4891 Unspecified atrial fibrillation: Secondary | ICD-10-CM

## 2015-10-07 LAB — POCT INR: INR: 1.7

## 2015-10-10 ENCOUNTER — Other Ambulatory Visit: Payer: Self-pay | Admitting: Interventional Cardiology

## 2015-10-21 ENCOUNTER — Ambulatory Visit (INDEPENDENT_AMBULATORY_CARE_PROVIDER_SITE_OTHER): Payer: PPO | Admitting: *Deleted

## 2015-10-21 DIAGNOSIS — I480 Paroxysmal atrial fibrillation: Secondary | ICD-10-CM

## 2015-10-21 DIAGNOSIS — I4891 Unspecified atrial fibrillation: Secondary | ICD-10-CM | POA: Diagnosis not present

## 2015-10-21 LAB — POCT INR: INR: 2.3

## 2015-12-02 ENCOUNTER — Ambulatory Visit (INDEPENDENT_AMBULATORY_CARE_PROVIDER_SITE_OTHER): Payer: PPO | Admitting: Pharmacist

## 2015-12-02 DIAGNOSIS — I4891 Unspecified atrial fibrillation: Secondary | ICD-10-CM

## 2015-12-02 DIAGNOSIS — I480 Paroxysmal atrial fibrillation: Secondary | ICD-10-CM | POA: Diagnosis not present

## 2015-12-02 LAB — POCT INR: INR: 2.6

## 2015-12-09 ENCOUNTER — Encounter: Payer: Self-pay | Admitting: Interventional Cardiology

## 2015-12-09 ENCOUNTER — Ambulatory Visit (INDEPENDENT_AMBULATORY_CARE_PROVIDER_SITE_OTHER): Payer: PPO | Admitting: Interventional Cardiology

## 2015-12-09 VITALS — BP 142/90 | HR 89 | Ht 71.0 in | Wt 226.0 lb

## 2015-12-09 DIAGNOSIS — I480 Paroxysmal atrial fibrillation: Secondary | ICD-10-CM

## 2015-12-09 DIAGNOSIS — I10 Essential (primary) hypertension: Secondary | ICD-10-CM | POA: Diagnosis not present

## 2015-12-09 DIAGNOSIS — R972 Elevated prostate specific antigen [PSA]: Secondary | ICD-10-CM | POA: Diagnosis not present

## 2015-12-09 DIAGNOSIS — Z7901 Long term (current) use of anticoagulants: Secondary | ICD-10-CM | POA: Diagnosis not present

## 2015-12-09 MED ORDER — DILTIAZEM HCL ER COATED BEADS 360 MG PO CP24: 360.0000 mg | ORAL_CAPSULE | Freq: Every day | ORAL | Status: DC

## 2015-12-09 NOTE — Patient Instructions (Signed)
Medication Instructions:  Your physician has recommended you make the following change in your medication:  1) INCREASE Diltizam to 360 mg by mouth ONCE daily   Labwork: none  Testing/Procedures: none  Follow-Up: Your physician wants you to follow-up in: 12 months with Dr. Katrinka Blazing. You will receive a reminder letter in the mail two months in advance. If you don't receive a letter, please call our office to schedule the follow-up appointment.   Any Other Special Instructions Will Be Listed Below (If Applicable). Your physician has requested that you regularly monitor (few times a week) and record your blood pressure readings at home. Please use the same machine at the same time of day to check your readings and record them to bring to your follow-up visit. Wanting to keep Blood Pressure less than: 140/90 and Heart Rate less than 85.  Please call our office if you have any problems keeping you Blood Pressure and Heart Rate below these goals.      If you need a refill on your cardiac medications before your next appointment, please call your pharmacy.

## 2015-12-09 NOTE — Progress Notes (Signed)
Cardiology Office Note   Date:  12/09/2015   ID:  RAEFORD BRANDENBURG, DOB 1945/11/15, MRN [id number]  PCP:  Lolita Patella, MD  Cardiologist:  Lesleigh Noe, MD   Chief Complaint  Patient presents with  . Atrial Fibrillation      History of Present Illness: Howard Pearson is a 70 y.o. male who presents for previous Roxicet will atrial fibrillation and currently in atrial fibrillation this morning; hypertension, and chronic anticoagulation therapy.  There are no cardiac complaints. He is headed to see urology soon. He denies orthopnea, PND, chest pain, and other CV symptoms. No episodes of bleeding or head trauma. No medication side effects.    Past Medical History  Diagnosis Date  . Hypertension   . Colon polyps   . Hyperlipemia   . CHF (congestive heart failure) (HCC)   . PAF (paroxysmal atrial fibrillation) (HCC) 03/2012    a. Dx 03/2012. b. Placed on amio and underwent DCCV 07/2012. Stopped amio 02/2013.  . Paroxysmal atrial flutter (HCC) 2010    a. Dx 2010. b. Recurrence of flutter 09/2012 (typical appearing), 04/2013.  Marland Kitchen BPH (benign prostatic hyperplasia)   . Gout   . Gangrenous cholecystitis     a. Adm 2013 with SIRS, UTI, transaminitis, dehydration, leukocytosis, gram neg bacteremia and gangrenous cholecystitis s/p cholecystectomy.    Past Surgical History  Procedure Laterality Date  . Finger tendon repair    . Cholecystectomy  06/06/2012    Procedure: LAPAROSCOPIC CHOLECYSTECTOMY WITH INTRAOPERATIVE CHOLANGIOGRAM;  Surgeon: Kandis Cocking, MD;  Location: [facility] OR;  Service: General;  Laterality: N/A;  . Tooth extraction    . Cardioversion  07/19/2012    Procedure: CARDIOVERSION;  Surgeon: Lesleigh Noe, MD;  [facility] END[facility]COPY;  Service: Cardiovascular;  Laterality: N/A;  h/p in file drawer     Current Outpatient Prescriptions  Medication Sig Dispense Refill  . allopurinol (ZYLOPRIM) 300 MG tablet Take 300 mg by mouth daily.    Marland Kitchen diltiazem  (TIAZAC) 240 MG 24 hr capsule Take 240 mg by mouth daily.    . Multiple Vitamin (MULTIVITAMIN WITH MINERALS) TABS Take 1 tablet by mouth daily.    . pravastatin (PRAVACHOL) 40 MG tablet Take 40 mg by mouth at bedtime.    . tamsulosin (FLOMAX) 0.4 MG CAPS capsule Take 0.4 mg by mouth daily.    Marland Kitchen warfarin (COUMADIN) 5 MG tablet TAKE AS DIRECTED BY ANTICOAGULATION CLINIC 30 tablet 3   No current facility-administered medications for this visit.    Allergies:   Penicillins    Social History:  The patient  reports that he quit smoking about 27 years ago. He has never used smokeless tobacco. He reports that he does not drink alcohol or use illicit drugs.   Family History:  The patient's family history includes Cancer in his maternal grandmother; Heart attack in his father; Stroke in his mother.    ROS:  Please see the history of present illness.   Otherwise, review of systems are positive for recent diagnosis of prostate cancer, anxiety, and insomnia..   All other systems are reviewed and negative.    PHYSICAL EXAM: VS:  BP 142/90 mmHg  Pulse 89  Ht  (1.803 m)  Wt 226 lb (102.513 kg)  BMI 31.53 kg/m2 , BMI Body mass index is 31.53 kg/(m^2). GEN: Well nourished, well developed, in no acute distress HEENT: normal Neck: no JVD, carotid bruits, or masses Cardiac: RRR.  There is no murmur, rub,  or gallop. There is no edema. Respiratory:  clear to auscultation bilaterally, normal work of breathing. GI: soft, nontender, nondistended, + BS MS: no deformity or atrophy Skin: warm and dry, no rash Neuro:  Strength and sensation are intact Psych: euthymic mood, full affect   EKG:  EKG is ordered today. The ekg reveals atrial fibrillation with moderate rate control ventricular response 89 bpm   Recent Labs: No results found for requested labs within last 365 days.    Lipid Panel No results found for: CHOL, TRIG, HDL, CHOLHDL, VLDL, LDLCALC, LDLDIRECT    Wt Readings from Last 3  Encounters:  12/09/15 226 lb (102.513 kg)  11/20/14 222 lb (100.699 kg)  08/26/14 217 lb (98.431 kg)      Other studies Reviewed: Additional studies/ records that were reviewed today include: Electronic health chart reviewed.. The findings include no new injuries other than recent diagnosis of prostate cancer..    ASSESSMENT AND PLAN:  1. PAF (paroxysmal atrial fibrillation) (HCC) Continuous atrial fibrillation, presumed. Mildly increased heart rate today.  2. Long term current use of anticoagulant therapy No bleeding on anticoagulation.  3. Essential hypertension Mildly elevated blood pressure.    Current medicines are reviewed at length with the patient today.  The patient has the following concerns regarding medicines: None.  The following changes/actions have been instituted:    Increase diltiazem SR to 360 mg per day  Monitor blood pressure and heart rate at home. With targets of 140/90 and less than 85 respectively.  Labs/ tests ordered today include:  No orders of the defined types were placed in this encounter.     Disposition:   FU with HS in 1 year  Signed, Lesleigh Noe, MD  12/09/2015 8:39 AM    [facility] [address] [place], [place], [place]  [address] Phone: [phone]; Fax: [phone]

## 2015-12-24 ENCOUNTER — Telehealth: Payer: Self-pay

## 2015-12-24 NOTE — Telephone Encounter (Signed)
Cardiac clearance placed in MR nurse fax box to be faxed to [facility]. Pt prostate biopsy is scheduled on 01/29/16. Pt is to HOLD coumadin 5 days prior. FYI fwd to CV[facility]

## 2015-12-24 NOTE — Telephone Encounter (Signed)
Made note to discuss this with patient at his visit on 3/15

## 2016-01-13 ENCOUNTER — Ambulatory Visit (INDEPENDENT_AMBULATORY_CARE_PROVIDER_SITE_OTHER): Payer: PPO | Admitting: Surgery

## 2016-01-13 DIAGNOSIS — I480 Paroxysmal atrial fibrillation: Secondary | ICD-10-CM

## 2016-01-13 DIAGNOSIS — I4891 Unspecified atrial fibrillation: Secondary | ICD-10-CM

## 2016-01-13 LAB — POCT INR: INR: 2.7

## 2016-02-23 NOTE — Progress Notes (Signed)
Cardiology Office Note    Date:  02/26/2016   ID:  Howard Pearson, DOB 1946-03-20, MRN [id number]  PCP:  Lolita Patella, MD  Cardiologist:  Dr. Katrinka Blazing    LE edema   History of Present Illness:  Howard Pearson is a 70 y.o. male with history of PAFib/flutter on coumadin, HTN, and history of mild LV dysfunction (EF40-45% in 2013--> normalized 2015) who presents to clinic for evaluation of LE edema.   Atrial flutter was diagnosed in 2010. His atrial fibrillation was diagnosed in 03/2012 when he had presented with decreased exercise tolerance. At that time, he was placed on amiodarone and underwent DCCV in 07/2012. He did well until he returned 09/2012 with typical appearing atrial flutter which spontaneously terminated. He stopped amio 02/2013. He presented again with atrial flutter 04/2013. He was referred to EP and by that time was in NSR. Conservative approach was chosen instead of AAD/ablation.  [facility] in 2013 showed EF 40-45%, left ventricular hypertrophy, moderate LAE, mild MVR, moderately thick MV, aortic valve sclerosis but no stenosis, diastolic dysfunction difficult to eval given AF, trivial TR, moderately elevated RVSP, borderline RAE.   He saw Howard Spies PA-C in the office for minor leg swelling and found to be back in afib with RVR. She increased cardizem and repeated labs and ECHO. Repeat 2D ECHO in 07/2014 showed normalization of his EF to 55-60% with akinesis of the inferior myocardium, moderate AR, mod MVP, mod LAE/RAE, mild RVE.  Howard Pearson reviewed this with Dr. Katrinka Blazing and no further workup for new WMA was felt to be necessary.   Last seen by Dr. Katrinka Blazing in 12/2015 and felt to be doing well from a cardiac standpoint. He was mildly hypertensive and diltiazem SR was increased to  daily.  Today he presents to clinic for evaluation of LE edema. He thinks it has to do with the increased diltiazem. He recently drove back from [place]. No CP or SOB, no orthopnea or PND. No  dizziness or syncope. He is getting a biopsy on his prostate next week and saw coumadin clinic today for instructions.   Past Medical History  Diagnosis Date  . Hypertension   . Colon polyps   . Hyperlipemia   . CHF (congestive heart failure) (HCC)   . PAF (paroxysmal atrial fibrillation) (HCC) 03/2012    a. Dx 03/2012. b. Placed on amio and underwent DCCV 07/2012. Stopped amio 02/2013.  . Paroxysmal atrial flutter (HCC) 2010    a. Dx 2010. b. Recurrence of flutter 09/2012 (typical appearing), 04/2013.  Marland Kitchen BPH (benign prostatic hyperplasia)   . Gout   . Gangrenous cholecystitis     a. Adm 2013 with SIRS, UTI, transaminitis, dehydration, leukocytosis, gram neg bacteremia and gangrenous cholecystitis s/p cholecystectomy.    Past Surgical History  Procedure Laterality Date  . Finger tendon repair    . Cholecystectomy  06/06/2012    Procedure: LAPAROSCOPIC CHOLECYSTECTOMY WITH INTRAOPERATIVE CHOLANGIOGRAM;  Surgeon: Kandis Cocking, MD;  Location: [facility] OR;  Service: General;  Laterality: N/A;  . Tooth extraction    . Cardioversion  07/19/2012    Procedure: CARDIOVERSION;  Surgeon: Lesleigh Noe, MD;  Location: [facility] ENDOSCOPY;  Service: Cardiovascular;  Laterality: N/A;  h/p in file drawer    Current Medications: Outpatient Prescriptions Prior to Visit  Medication Sig Dispense Refill  . allopurinol (ZYLOPRIM) 300 MG tablet Take 300 mg by mouth daily.    Marland Kitchen diltiazem (CARDIZEM CD) 360 MG 24 hr capsule Take  1 capsule (360 mg total) by mouth daily. 90 capsule 3  . Multiple Vitamin (MULTIVITAMIN WITH MINERALS) TABS Take 1 tablet by mouth daily.    . pravastatin (PRAVACHOL) 40 MG tablet Take 40 mg by mouth at bedtime.    . tamsulosin (FLOMAX) 0.4 MG CAPS capsule Take 0.4 mg by mouth daily.    Marland Kitchen. warfarin (COUMADIN) 5 MG tablet TAKE AS DIRECTED BY ANTICOAGULATION CLINIC 30 tablet 3  . diltiazem (TIAZAC) 240 MG 24 hr capsule Take 240 mg by mouth daily.     No facility-administered medications prior to  visit.     Allergies:   Penicillins   Social History   Social History  . Marital Status: Married    Spouse Name: N/A  . Number of Children: N/A  . Years of Education: N/A   Social History Main Topics  . Smoking status: Former Smoker -- 1.00 packs/day    Quit date: 06/03/1988  . Smokeless tobacco: Never Used  . Alcohol Use: No  . Drug Use: No  . Sexual Activity: Not Asked   Other Topics Concern  . None   Social History Narrative     Family History:  The patient's family history includes Cancer in his maternal grandmother; Heart attack in his father; Stroke in his mother.   ROS:   Please see the history of present illness.    ROS All other systems reviewed and are negative.   PHYSICAL EXAM:   VS:  BP 128/82 mmHg  Pulse 57  Ht 5\' 11"  (1.803 m)  Wt 231 lb (104.781 kg)  BMI 32.23 kg/m2  SpO2 98%   GEN: Well nourished, well developed, in no acute distress HEENT: normal Neck: no JVD, carotid bruits, or masses Cardiac: RRR; no murmurs, rubs, or gallops, 1-2 + bilateral LE edema  Respiratory:  clear to auscultation bilaterally, normal work of breathing GI: soft, nontender, nondistended, + BS MS: no deformity or atrophy Skin: warm and dry, no rash Neuro:  Alert and Oriented x 3, Strength and sensation are intact Psych: euthymic mood, full affect  Wt Readings from Last 3 Encounters:  02/26/16 231 lb (104.781 kg)  12/09/15 226 lb (102.513 kg)  11/20/14 222 lb (100.699 kg)      Studies/Labs Reviewed:   EKG:  EKG is NOT ordered today.    Recent Labs: No results found for requested labs within last 365 days.   Lipid Panel No results found for: CHOL, TRIG, HDL, CHOLHDL, VLDL, LDLCALC, LDLDIRECT  Additional studies/ records that were reviewed today include:  2D ECHO: 07/21/2014 LV EF: 55% -  60% Study Conclusion - Left ventricle: The cavity size was normal. Wall thickness was increased in a pattern of mild LVH. Systolic function was normal. The estimated  ejection fraction was in the range of 55% to 60%. There is akinesis of the apical myocardium. - Aortic valve: There was mild regurgitation. - Mitral valve: Moderate prolapse, involving the anterior leaflet and the posterior leaflet. - Left atrium: The atrium was moderately dilated. - Right ventricle: The cavity size was mildly dilated. Wall thickness was normal. - Right atrium: The atrium was mildly dilated.    ASSESSMENT:    1. Bilateral edema of lower extremity   2. Chronic atrial fibrillation (HCC)   3. Essential hypertension      PLAN:  In order of problems listed above:  LE edema: will order a TSH, BMET and BNP.  INR has been therapeutic, so low risk for DVT.  Probably not related  to recent increase in diltiazem. Will keep this the same.   Chronic atrial fibrillation: This is likely chronic now. Rate control is good. CHADSVASC score at least 4. Continue Warfarin for stroke prophylaxis. He is asymptomatic. Would recommend continuing rate control strategy.   Essential hypertension: Controlled.   Prostate biopsy: seen in coumadin clinic today for directions.   Medication Adjustments/Labs and Tests Ordered: Current medicines are reviewed at length with the patient today.  Concerns regarding medicines are outlined above.  Medication changes, Labs and Tests ordered today are listed in the Patient Instructions below. Patient Instructions  Medication Instructions:  Your physician recommends that you continue on your current medications as directed. Please refer to the Current Medication list given to you today.   Labwork: TODAY:  BNP & BMP  Testing/Procedures: None ordered  Follow-Up: Your physician recommends that you schedule a follow-up appointment in: BASED UPON YOUR LAB RESULTS   Any Other Special Instructions Will Be Listed Below (If Applicable).     If you need a refill on your cardiac medications before your next appointment, please call your  pharmacy.       Charlestine Massed  02/26/2016 4:15 PM    [facility] [address] [place], [place], [place]  [address] Phone: [phone]; Fax: [phone]

## 2016-02-26 ENCOUNTER — Ambulatory Visit (INDEPENDENT_AMBULATORY_CARE_PROVIDER_SITE_OTHER): Payer: PPO | Admitting: *Deleted

## 2016-02-26 ENCOUNTER — Encounter: Payer: Self-pay | Admitting: Physician Assistant

## 2016-02-26 ENCOUNTER — Ambulatory Visit (INDEPENDENT_AMBULATORY_CARE_PROVIDER_SITE_OTHER): Payer: PPO | Admitting: Physician Assistant

## 2016-02-26 VITALS — BP 128/82 | HR 57 | Ht 71.0 in | Wt 231.0 lb

## 2016-02-26 DIAGNOSIS — I480 Paroxysmal atrial fibrillation: Secondary | ICD-10-CM

## 2016-02-26 DIAGNOSIS — I10 Essential (primary) hypertension: Secondary | ICD-10-CM

## 2016-02-26 DIAGNOSIS — I482 Chronic atrial fibrillation, unspecified: Secondary | ICD-10-CM

## 2016-02-26 DIAGNOSIS — R600 Localized edema: Secondary | ICD-10-CM

## 2016-02-26 DIAGNOSIS — I4891 Unspecified atrial fibrillation: Secondary | ICD-10-CM

## 2016-02-26 LAB — BASIC METABOLIC PANEL
BUN: 13 mg/dL (ref 7–25)
CALCIUM: 9.3 mg/dL (ref 8.6–10.3)
CHLORIDE: 107 mmol/L (ref 98–110)
CO2: 22 mmol/L (ref 20–31)
CREATININE: 1.05 mg/dL (ref 0.70–1.18)
GLUCOSE: 82 mg/dL (ref 65–99)
Potassium: 3.8 mmol/L (ref 3.5–5.3)
SODIUM: 143 mmol/L (ref 135–146)

## 2016-02-26 LAB — POCT INR: INR: 2.5

## 2016-02-26 NOTE — Patient Instructions (Signed)
Medication Instructions:  Your physician recommends that you continue on your current medications as directed. Please refer to the Current Medication list given to you today.   Labwork: TODAY:  BNP & BMP  Testing/Procedures: None ordered  Follow-Up: Your physician recommends that you schedule a follow-up appointment in: BASED UPON YOUR LAB RESULTS   Any Other Special Instructions Will Be Listed Below (If Applicable).     If you need a refill on your cardiac medications before your next appointment, please call your pharmacy.

## 2016-02-27 LAB — TSH: TSH: 1.63 mIU/L (ref 0.40–4.50)

## 2016-02-27 LAB — BRAIN NATRIURETIC PEPTIDE: Brain Natriuretic Peptide: 142.6 pg/mL — ABNORMAL HIGH (ref <100)

## 2016-02-29 ENCOUNTER — Other Ambulatory Visit: Payer: Self-pay | Admitting: *Deleted

## 2016-02-29 DIAGNOSIS — R600 Localized edema: Secondary | ICD-10-CM

## 2016-02-29 MED ORDER — FUROSEMIDE 40 MG PO TABS: ORAL_TABLET | ORAL | Status: DC

## 2016-02-29 MED ORDER — POTASSIUM CHLORIDE CRYS ER 20 MEQ PO TBCR: EXTENDED_RELEASE_TABLET | ORAL | Status: DC

## 2016-02-29 NOTE — Telephone Encounter (Signed)
I spoke with the patient. Reviewed ways to reduce dietary sodium. He is agreeable to lasix 40 mg and potassium 20 mEq twice per week or until his BLE edema is improved. Lab appointment scheduled for 03/22/16.  Pt is aware I am sending to Dr. Smith to inform. 

## 2016-02-29 NOTE — Telephone Encounter (Signed)
-----   Message from Pricilla RifflePaula Ross V, MD sent at 02/28/2016 10:03 PM EDT ----- Pt seen with Howard Pearson as DOD Elelctrolytes are OK Thyroid function is normal Fluid is up midly  May be related to salt intake  Could try lasix 40 mg with 20 KCL  Take 2x per wk or until swelling down CHeck BMET and BNP in 3 wks

## 2016-02-29 NOTE — Telephone Encounter (Deleted)
I spoke with the patient. Reviewed ways to reduce dietary sodium. He is agreeable to lasix 40 mg and potassium 20 mEq twice per week or until his BLE edema is improved. Lab appointment scheduled for 03/22/16.  Pt is aware I am sending to Dr. Katrinka BlazingSmith to inform.

## 2016-03-02 DIAGNOSIS — R972 Elevated prostate specific antigen [PSA]: Secondary | ICD-10-CM | POA: Diagnosis not present

## 2016-03-09 ENCOUNTER — Other Ambulatory Visit: Payer: Self-pay | Admitting: Interventional Cardiology

## 2016-03-10 ENCOUNTER — Ambulatory Visit (INDEPENDENT_AMBULATORY_CARE_PROVIDER_SITE_OTHER): Payer: PPO | Admitting: *Deleted

## 2016-03-10 DIAGNOSIS — I480 Paroxysmal atrial fibrillation: Secondary | ICD-10-CM

## 2016-03-10 DIAGNOSIS — I4891 Unspecified atrial fibrillation: Secondary | ICD-10-CM

## 2016-03-10 LAB — POCT INR: INR: 1.4

## 2016-03-22 ENCOUNTER — Ambulatory Visit (INDEPENDENT_AMBULATORY_CARE_PROVIDER_SITE_OTHER): Payer: PPO | Admitting: *Deleted

## 2016-03-22 ENCOUNTER — Other Ambulatory Visit (INDEPENDENT_AMBULATORY_CARE_PROVIDER_SITE_OTHER): Payer: PPO | Admitting: *Deleted

## 2016-03-22 DIAGNOSIS — R600 Localized edema: Secondary | ICD-10-CM | POA: Diagnosis not present

## 2016-03-22 DIAGNOSIS — I480 Paroxysmal atrial fibrillation: Secondary | ICD-10-CM

## 2016-03-22 DIAGNOSIS — I4891 Unspecified atrial fibrillation: Secondary | ICD-10-CM | POA: Diagnosis not present

## 2016-03-22 LAB — BASIC METABOLIC PANEL
BUN: 16 mg/dL (ref 7–25)
CHLORIDE: 109 mmol/L (ref 98–110)
CO2: 22 mmol/L (ref 20–31)
Calcium: 8.6 mg/dL (ref 8.6–10.3)
Creat: 0.99 mg/dL (ref 0.70–1.18)
Glucose, Bld: 118 mg/dL — ABNORMAL HIGH (ref 65–99)
POTASSIUM: 3.6 mmol/L (ref 3.5–5.3)
SODIUM: 141 mmol/L (ref 135–146)

## 2016-03-22 LAB — POCT INR: INR: 2.2

## 2016-03-22 LAB — BRAIN NATRIURETIC PEPTIDE: BRAIN NATRIURETIC PEPTIDE: 141.5 pg/mL — AB (ref <100)

## 2016-03-24 ENCOUNTER — Telehealth: Payer: Self-pay | Admitting: *Deleted

## 2016-03-24 DIAGNOSIS — I480 Paroxysmal atrial fibrillation: Secondary | ICD-10-CM

## 2016-03-24 MED ORDER — FUROSEMIDE 40 MG PO TABS: ORAL_TABLET | ORAL | Status: DC

## 2016-03-24 NOTE — Telephone Encounter (Signed)
Called to give pt Dr.Smith's response. [email or web address] identifies himself by name on his voicemail. Dr.Smith is in agreement with Dr.Ross's recommendation. Lab appt for a bmet scheduled the sameday as pt cvrr appt 6/20

## 2016-03-24 NOTE — Telephone Encounter (Signed)
I agree with this recommendation.

## 2016-03-24 NOTE — Telephone Encounter (Addendum)
Spoke with patient.  Reviewed lab results. He has been taking lasix twice weekly. His swelling is better than it was when he came to the office on 4/28 but still has it every day, better in the mornings, worse in evenings.  He will increase his lasix to 3 times per week.  He is aware am forwarding [facility]Smith [facility] and further recommendations/labs.

## 2016-03-24 NOTE — Telephone Encounter (Signed)
-----   Message from Pricilla RifflePaula Ross V, MD sent at 03/22/2016 10:34 PM EDT ----- Electrolytes and kidney funciton are OK Fluid numbere remains mildly increased He should be taking lasix 2x per wk If still edematous in legs could increase to every other day  Check BMET in 3 wks If OK/improved keep on same regime Pt normally followed by Howard Pearson

## 2016-04-19 ENCOUNTER — Ambulatory Visit (INDEPENDENT_AMBULATORY_CARE_PROVIDER_SITE_OTHER): Payer: PPO | Admitting: *Deleted

## 2016-04-19 ENCOUNTER — Other Ambulatory Visit (INDEPENDENT_AMBULATORY_CARE_PROVIDER_SITE_OTHER): Payer: PPO

## 2016-04-19 DIAGNOSIS — I4891 Unspecified atrial fibrillation: Secondary | ICD-10-CM | POA: Diagnosis not present

## 2016-04-19 DIAGNOSIS — I480 Paroxysmal atrial fibrillation: Secondary | ICD-10-CM | POA: Diagnosis not present

## 2016-04-19 LAB — BASIC METABOLIC PANEL
BUN: 17 mg/dL (ref 7–25)
CALCIUM: 8.7 mg/dL (ref 8.6–10.3)
CO2: 25 mmol/L (ref 20–31)
Chloride: 109 mmol/L (ref 98–110)
Creat: 1.16 mg/dL (ref 0.70–1.18)
GLUCOSE: 100 mg/dL — AB (ref 65–99)
Potassium: 3.6 mmol/L (ref 3.5–5.3)
Sodium: 142 mmol/L (ref 135–146)

## 2016-04-19 LAB — POCT INR: INR: 2.6

## 2016-05-31 ENCOUNTER — Ambulatory Visit (INDEPENDENT_AMBULATORY_CARE_PROVIDER_SITE_OTHER): Payer: PPO | Admitting: *Deleted

## 2016-05-31 DIAGNOSIS — I4891 Unspecified atrial fibrillation: Secondary | ICD-10-CM | POA: Diagnosis not present

## 2016-05-31 DIAGNOSIS — I480 Paroxysmal atrial fibrillation: Secondary | ICD-10-CM | POA: Diagnosis not present

## 2016-05-31 LAB — POCT INR: INR: 3

## 2016-07-12 ENCOUNTER — Ambulatory Visit (INDEPENDENT_AMBULATORY_CARE_PROVIDER_SITE_OTHER): Payer: PPO | Admitting: Pharmacist

## 2016-07-12 DIAGNOSIS — I480 Paroxysmal atrial fibrillation: Secondary | ICD-10-CM | POA: Diagnosis not present

## 2016-07-12 DIAGNOSIS — I4891 Unspecified atrial fibrillation: Secondary | ICD-10-CM | POA: Diagnosis not present

## 2016-07-12 LAB — POCT INR: INR: 3.4

## 2016-07-26 ENCOUNTER — Other Ambulatory Visit: Payer: Self-pay | Admitting: Interventional Cardiology

## 2016-08-02 DIAGNOSIS — I5030 Unspecified diastolic (congestive) heart failure: Secondary | ICD-10-CM | POA: Diagnosis not present

## 2016-08-02 DIAGNOSIS — Z23 Encounter for immunization: Secondary | ICD-10-CM | POA: Diagnosis not present

## 2016-08-02 DIAGNOSIS — Z1389 Encounter for screening for other disorder: Secondary | ICD-10-CM | POA: Diagnosis not present

## 2016-08-02 DIAGNOSIS — E7800 Pure hypercholesterolemia, unspecified: Secondary | ICD-10-CM | POA: Diagnosis not present

## 2016-08-02 DIAGNOSIS — N400 Enlarged prostate without lower urinary tract symptoms: Secondary | ICD-10-CM | POA: Diagnosis not present

## 2016-08-02 DIAGNOSIS — Z0000 Encounter for general adult medical examination without abnormal findings: Secondary | ICD-10-CM | POA: Diagnosis not present

## 2016-08-02 DIAGNOSIS — I4891 Unspecified atrial fibrillation: Secondary | ICD-10-CM | POA: Diagnosis not present

## 2016-08-02 DIAGNOSIS — M545 Low back pain: Secondary | ICD-10-CM | POA: Diagnosis not present

## 2016-08-02 DIAGNOSIS — M109 Gout, unspecified: Secondary | ICD-10-CM | POA: Diagnosis not present

## 2016-08-02 DIAGNOSIS — Z125 Encounter for screening for malignant neoplasm of prostate: Secondary | ICD-10-CM | POA: Diagnosis not present

## 2016-08-02 DIAGNOSIS — R233 Spontaneous ecchymoses: Secondary | ICD-10-CM | POA: Diagnosis not present

## 2016-08-09 ENCOUNTER — Ambulatory Visit (INDEPENDENT_AMBULATORY_CARE_PROVIDER_SITE_OTHER): Payer: PPO | Admitting: *Deleted

## 2016-08-09 DIAGNOSIS — I4891 Unspecified atrial fibrillation: Secondary | ICD-10-CM

## 2016-08-09 DIAGNOSIS — I480 Paroxysmal atrial fibrillation: Secondary | ICD-10-CM | POA: Diagnosis not present

## 2016-08-09 LAB — POCT INR: INR: 4.5

## 2016-08-16 DIAGNOSIS — H43812 Vitreous degeneration, left eye: Secondary | ICD-10-CM | POA: Diagnosis not present

## 2016-08-16 DIAGNOSIS — H2513 Age-related nuclear cataract, bilateral: Secondary | ICD-10-CM | POA: Diagnosis not present

## 2016-08-16 DIAGNOSIS — H5213 Myopia, bilateral: Secondary | ICD-10-CM | POA: Diagnosis not present

## 2016-08-16 DIAGNOSIS — H524 Presbyopia: Secondary | ICD-10-CM | POA: Diagnosis not present

## 2016-08-16 DIAGNOSIS — H35433 Paving stone degeneration of retina, bilateral: Secondary | ICD-10-CM | POA: Diagnosis not present

## 2016-08-16 DIAGNOSIS — H52222 Regular astigmatism, left eye: Secondary | ICD-10-CM | POA: Diagnosis not present

## 2016-08-23 ENCOUNTER — Ambulatory Visit (INDEPENDENT_AMBULATORY_CARE_PROVIDER_SITE_OTHER): Payer: PPO | Admitting: *Deleted

## 2016-08-23 DIAGNOSIS — I480 Paroxysmal atrial fibrillation: Secondary | ICD-10-CM | POA: Diagnosis not present

## 2016-08-23 DIAGNOSIS — I4891 Unspecified atrial fibrillation: Secondary | ICD-10-CM

## 2016-08-23 LAB — POCT INR: INR: 3.1

## 2016-09-07 DIAGNOSIS — R972 Elevated prostate specific antigen [PSA]: Secondary | ICD-10-CM | POA: Diagnosis not present

## 2016-09-12 DIAGNOSIS — H52222 Regular astigmatism, left eye: Secondary | ICD-10-CM | POA: Diagnosis not present

## 2016-09-12 DIAGNOSIS — H5213 Myopia, bilateral: Secondary | ICD-10-CM | POA: Diagnosis not present

## 2016-09-12 DIAGNOSIS — H524 Presbyopia: Secondary | ICD-10-CM | POA: Diagnosis not present

## 2016-09-12 DIAGNOSIS — H35352 Cystoid macular degeneration, left eye: Secondary | ICD-10-CM | POA: Diagnosis not present

## 2016-09-13 ENCOUNTER — Ambulatory Visit (INDEPENDENT_AMBULATORY_CARE_PROVIDER_SITE_OTHER): Payer: PPO | Admitting: *Deleted

## 2016-09-13 DIAGNOSIS — I480 Paroxysmal atrial fibrillation: Secondary | ICD-10-CM | POA: Diagnosis not present

## 2016-09-13 DIAGNOSIS — I4891 Unspecified atrial fibrillation: Secondary | ICD-10-CM

## 2016-09-13 LAB — POCT INR: INR: 3

## 2016-10-11 ENCOUNTER — Ambulatory Visit (INDEPENDENT_AMBULATORY_CARE_PROVIDER_SITE_OTHER): Payer: PPO | Admitting: *Deleted

## 2016-10-11 DIAGNOSIS — I480 Paroxysmal atrial fibrillation: Secondary | ICD-10-CM

## 2016-10-11 DIAGNOSIS — I4891 Unspecified atrial fibrillation: Secondary | ICD-10-CM

## 2016-10-11 LAB — POCT INR: INR: 2.1

## 2016-11-10 ENCOUNTER — Telehealth: Payer: Self-pay | Admitting: Interventional Cardiology

## 2016-11-10 MED ORDER — DILTIAZEM HCL ER COATED BEADS 240 MG PO CP24: 240.0000 mg | ORAL_CAPSULE | Freq: Every day | ORAL | 3 refills | Status: DC

## 2016-11-10 MED ORDER — DILTIAZEM HCL ER COATED BEADS 120 MG PO CP24: 120.0000 mg | ORAL_CAPSULE | Freq: Every day | ORAL | 3 refills | Status: DC

## 2016-11-10 NOTE — Telephone Encounter (Signed)
Spoke with pt's wife, DPR on file.  Insurance no longer will cover Diltiazem 360mg .  She states they will cover 120mg  and 240mg .  She wants to know what Dr. Katrinka BlazingSmith would like for pt to take in place of Dilt 360?

## 2016-11-10 NOTE — Telephone Encounter (Signed)
Spoke with pt's wife and informed her of recommendations per Dr. Katrinka BlazingSmith. Wife verbalized understanding and was in agreement with this plan.

## 2016-11-10 NOTE — Telephone Encounter (Signed)
Go to 240 mg in the morning and 120 mg at night. He will need to sever prescriptions. I am assuming that he has been taking the 360 mg daily?

## 2016-11-10 NOTE — Telephone Encounter (Signed)
Howard Pearson is calling because she states that her husband's  (Howard Pearson) medication diltiazem, 360 mg will no longer be covered by his insurance and she would like to know what are the next steps. Please call, thanks.

## 2016-11-15 ENCOUNTER — Ambulatory Visit (INDEPENDENT_AMBULATORY_CARE_PROVIDER_SITE_OTHER): Payer: PPO | Admitting: *Deleted

## 2016-11-15 DIAGNOSIS — I480 Paroxysmal atrial fibrillation: Secondary | ICD-10-CM | POA: Diagnosis not present

## 2016-11-15 DIAGNOSIS — I4891 Unspecified atrial fibrillation: Secondary | ICD-10-CM

## 2016-11-15 LAB — POCT INR: INR: 2.9

## 2016-12-08 NOTE — Progress Notes (Signed)
Cardiology Office Note    Date:  12/09/2016   ID:  Howard Pearson, DOB 08/10/1946, MRN [id number]  PCP:  Lolita Patella, MD  Cardiologist: Lesleigh Noe, MD   Chief Complaint  Patient presents with  . Coronary Artery Disease  . Atrial Fibrillation    History of Present Illness:  Howard Pearson is a 71 y.o. male with history of PA Fib /f lutter on coumadin, HTN, and history of mild LV dysfunction (EF40-45% in 2013--> normalized 2015) who presents to clinic for evaluation of cardiovascular problems.  Doing well. Denies angina, dyspnea, syncope, palpitations. No bleeding on Coumadin.   Past Medical History:  Diagnosis Date  . BPH (benign prostatic hyperplasia)   . CHF (congestive heart failure) (HCC)   . Colon polyps   . Gangrenous cholecystitis    a. Adm 2013 with SIRS, UTI, transaminitis, dehydration, leukocytosis, gram neg bacteremia and gangrenous cholecystitis s/p cholecystectomy.  . Gout   . Hyperlipemia   . Hypertension   . PAF (paroxysmal atrial fibrillation) (HCC) 03/2012   a. Dx 03/2012. b. Placed on amio and underwent DCCV 07/2012. Stopped amio 02/2013.  . Paroxysmal atrial flutter (HCC) 2010   a. Dx 2010. b. Recurrence of flutter 09/2012 (typical appearing), 04/2013.    Past Surgical History:  Procedure Laterality Date  . CARDIOVERSION  07/19/2012   Procedure: CARDIOVERSION;  Surgeon: Lesleigh Noe, MD;  Location: [facility] ENDOSCOPY;  Service: Cardiovascular;  Laterality: N/A;  h/p in file drawer  . CHOLECYSTECTOMY  06/06/2012   Procedure: LAPAROSCOPIC CHOLECYSTECTOMY WITH INTRAOPERATIVE CHOLANGIOGRAM;  Surgeon: Kandis Cocking, MD;  Location: [facility] OR;  Service: General;  Laterality: N/A;  . FINGER TENDON REPAIR    . TOOTH EXTRACTION      Current Medications: Outpatient Medications Prior to Visit  Medication Sig Dispense Refill  . allopurinol (ZYLOPRIM) 300 MG tablet Take 300 mg by mouth daily.    Marland Kitchen diltiazem (CARDIZEM CD) 120 MG 24 hr capsule Take 1  capsule (120 mg total) by mouth daily. At night 90 capsule 3  . diltiazem (CARDIZEM CD) 240 MG 24 hr capsule Take 1 capsule (240 mg total) by mouth daily. In the morning. 90 capsule 3  . Multiple Vitamin (MULTIVITAMIN WITH MINERALS) TABS Take 1 tablet by mouth daily.    . pravastatin (PRAVACHOL) 40 MG tablet Take 40 mg by mouth at bedtime.    . tamsulosin (FLOMAX) 0.4 MG CAPS capsule Take 0.4 mg by mouth daily.    Marland Kitchen warfarin (COUMADIN) 5 MG tablet TAKE AS DIRECTED BY ANTICOAGULATION CLINIC 30 tablet 3  . furosemide (LASIX) 40 MG tablet Take 1 tablet by mouth three times per week 30 tablet 3  . potassium chloride SA (K-DUR,KLOR-CON) 20 MEQ tablet Take one tablet by mouth 2 times per week.  Take on the same day as Lasix. (Patient not taking: Reported on 12/09/2016) 30 tablet 3   No facility-administered medications prior to visit.      Allergies:   Penicillins   Social History   Social History  . Marital status: Married    Spouse name: N/A  . Number of children: N/A  . Years of education: N/A   Social History Main Topics  . Smoking status: Former Smoker    Packs/day: 1.00    Quit date: 06/03/1988  . Smokeless tobacco: Never Used  . Alcohol use No  . Drug use: No  . Sexual activity: Not Asked   Other Topics Concern  . None  Social History Narrative  . None     Family History:  The patient's family history includes Cancer in his maternal grandmother; Heart attack in his father; Stroke in his mother.   ROS:   Please see the history of present illness.    Nasal congestion but otherwise unremarkable. Diltiazem SR 360 mg was changed from once a day to diltiazem CD broken down into 240 and 120 mg tablets . Initially he felt there was a decrease in his overall energy but this is improved. All other systems reviewed and are negative.   PHYSICAL EXAM:   VS:  BP 116/72 (BP Location: Left Arm)   Pulse 76   Ht 5\' 11"  (1.803 m)   Wt 228 lb 1.9 oz (103.5 kg)   BMI 31.82 kg/m    GEN:  Well nourished, well developed, in no acute distress  HEENT: normal  Neck: no JVD, carotid bruits, or masses Cardiac: IIRR; no murmurs, rubs, or gallops,no edema  Respiratory:  clear to auscultation bilaterally, normal work of breathing GI: soft, nontender, nondistended, + BS MS: no deformity or atrophy  Skin: warm and dry, no rash Neuro:  Alert and Oriented x 3, Strength and sensation are intact Psych: euthymic mood, full affect  Wt Readings from Last 3 Encounters:  12/09/16 228 lb 1.9 oz (103.5 kg)  02/26/16 231 lb (104.8 kg)  12/09/15 226 lb (102.5 kg)      Studies/Labs Reviewed:   EKG:  EKG  Atrial fibrillation with controlled rate at 76 bpm. No evidence of Q-wave infarction. No significant change when compared to prior tracings.  Recent Labs: 02/26/2016: TSH 1.63 03/22/2016: Brain Natriuretic Peptide 141.5 04/19/2016: BUN 17; Creat 1.16; Potassium 3.6; Sodium 142   Lipid Panel No results found for: CHOL, TRIG, HDL, CHOLHDL, VLDL, LDLCALC, LDLDIRECT  Additional studies/ records that were reviewed today include:  None    ASSESSMENT:    1. PAF (paroxysmal atrial fibrillation) (HCC)   2. Essential hypertension   3. Long term current use of anticoagulant therapy      PLAN:  In order of problems listed above:  1. Continuous atrial fibrillation with good rate control and asymptomatic. 2. Excellent control.He is cautioned to avoid pseudoephedrine. 3. No bleeding complications.  Clinical follow-up in one year. No change in current medical regimen.      Medication Adjustments/Labs and Tests Ordered: Current medicines are reviewed at length with the patient today.  Concerns regarding medicines are outlined above.  Medication changes, Labs and Tests ordered today are listed in the Patient Instructions below. Patient Instructions  Medication Instructions:  None  Labwork: None  Testing/Procedures: None  Follow-Up: Your physician wants you to follow-up in: 1  year with Dr. Katrinka BlazingSmith.  You will receive a reminder letter in the mail two months in advance. If you don't receive a letter, please call our office to schedule the follow-up appointment.   Any Other Special Instructions Will Be Listed Below (If Applicable).     If you need a refill on your cardiac medications before your next appointment, please call your pharmacy.      Signed, Lesleigh NoeHenry W Jailynn Lavalais III, MD  12/09/2016 2:46 PM    [facility] [address], [place], [place]  [address] Phone: [phone]) [phone]; Fax: [phone](336) [phone]

## 2016-12-09 ENCOUNTER — Encounter: Payer: Self-pay | Admitting: Interventional Cardiology

## 2016-12-09 ENCOUNTER — Ambulatory Visit (INDEPENDENT_AMBULATORY_CARE_PROVIDER_SITE_OTHER): Payer: PPO | Admitting: Interventional Cardiology

## 2016-12-09 VITALS — BP 116/72 | HR 76 | Ht 71.0 in | Wt 228.1 lb

## 2016-12-09 DIAGNOSIS — I480 Paroxysmal atrial fibrillation: Secondary | ICD-10-CM

## 2016-12-09 DIAGNOSIS — I10 Essential (primary) hypertension: Secondary | ICD-10-CM | POA: Diagnosis not present

## 2016-12-09 DIAGNOSIS — Z7901 Long term (current) use of anticoagulants: Secondary | ICD-10-CM | POA: Diagnosis not present

## 2016-12-09 MED ORDER — POTASSIUM CHLORIDE CRYS ER 20 MEQ PO TBCR: 20.0000 meq | EXTENDED_RELEASE_TABLET | ORAL | 3 refills | Status: DC

## 2016-12-09 MED ORDER — FUROSEMIDE 40 MG PO TABS: ORAL_TABLET | ORAL | 3 refills | Status: DC

## 2016-12-09 NOTE — Patient Instructions (Signed)

## 2016-12-13 DIAGNOSIS — H35352 Cystoid macular degeneration, left eye: Secondary | ICD-10-CM | POA: Diagnosis not present

## 2016-12-14 ENCOUNTER — Other Ambulatory Visit: Payer: Self-pay | Admitting: Interventional Cardiology

## 2016-12-28 ENCOUNTER — Ambulatory Visit (INDEPENDENT_AMBULATORY_CARE_PROVIDER_SITE_OTHER): Payer: PPO | Admitting: *Deleted

## 2016-12-28 DIAGNOSIS — I480 Paroxysmal atrial fibrillation: Secondary | ICD-10-CM | POA: Diagnosis not present

## 2016-12-28 DIAGNOSIS — I4891 Unspecified atrial fibrillation: Secondary | ICD-10-CM | POA: Diagnosis not present

## 2016-12-28 LAB — POCT INR: INR: 2.7

## 2017-02-08 ENCOUNTER — Ambulatory Visit (INDEPENDENT_AMBULATORY_CARE_PROVIDER_SITE_OTHER): Payer: PPO | Admitting: *Deleted

## 2017-02-08 DIAGNOSIS — I480 Paroxysmal atrial fibrillation: Secondary | ICD-10-CM | POA: Diagnosis not present

## 2017-02-08 DIAGNOSIS — I4891 Unspecified atrial fibrillation: Secondary | ICD-10-CM | POA: Diagnosis not present

## 2017-02-08 LAB — POCT INR: INR: 2.5

## 2017-03-22 ENCOUNTER — Ambulatory Visit (INDEPENDENT_AMBULATORY_CARE_PROVIDER_SITE_OTHER): Payer: PPO | Admitting: *Deleted

## 2017-03-22 DIAGNOSIS — I480 Paroxysmal atrial fibrillation: Secondary | ICD-10-CM | POA: Diagnosis not present

## 2017-03-22 DIAGNOSIS — I4891 Unspecified atrial fibrillation: Secondary | ICD-10-CM | POA: Diagnosis not present

## 2017-03-22 LAB — POCT INR: INR: 2.5

## 2017-05-02 ENCOUNTER — Ambulatory Visit (INDEPENDENT_AMBULATORY_CARE_PROVIDER_SITE_OTHER): Payer: PPO | Admitting: *Deleted

## 2017-05-02 DIAGNOSIS — I4891 Unspecified atrial fibrillation: Secondary | ICD-10-CM | POA: Diagnosis not present

## 2017-05-02 DIAGNOSIS — I480 Paroxysmal atrial fibrillation: Secondary | ICD-10-CM | POA: Diagnosis not present

## 2017-05-02 LAB — POCT INR: INR: 2.4

## 2017-05-04 ENCOUNTER — Other Ambulatory Visit: Payer: Self-pay | Admitting: Interventional Cardiology

## 2017-06-13 ENCOUNTER — Ambulatory Visit (INDEPENDENT_AMBULATORY_CARE_PROVIDER_SITE_OTHER): Payer: PPO | Admitting: *Deleted

## 2017-06-13 DIAGNOSIS — I4891 Unspecified atrial fibrillation: Secondary | ICD-10-CM

## 2017-06-13 DIAGNOSIS — I480 Paroxysmal atrial fibrillation: Secondary | ICD-10-CM

## 2017-06-13 LAB — POCT INR: INR: 2.7

## 2017-07-25 ENCOUNTER — Ambulatory Visit (INDEPENDENT_AMBULATORY_CARE_PROVIDER_SITE_OTHER): Payer: PPO | Admitting: *Deleted

## 2017-07-25 DIAGNOSIS — Z5181 Encounter for therapeutic drug level monitoring: Secondary | ICD-10-CM

## 2017-07-25 DIAGNOSIS — I4891 Unspecified atrial fibrillation: Secondary | ICD-10-CM

## 2017-07-25 DIAGNOSIS — I480 Paroxysmal atrial fibrillation: Secondary | ICD-10-CM

## 2017-07-25 LAB — POCT INR: INR: 2.3

## 2017-08-08 DIAGNOSIS — R233 Spontaneous ecchymoses: Secondary | ICD-10-CM | POA: Diagnosis not present

## 2017-08-08 DIAGNOSIS — Z0000 Encounter for general adult medical examination without abnormal findings: Secondary | ICD-10-CM | POA: Diagnosis not present

## 2017-08-08 DIAGNOSIS — Z125 Encounter for screening for malignant neoplasm of prostate: Secondary | ICD-10-CM | POA: Diagnosis not present

## 2017-08-08 DIAGNOSIS — Z1389 Encounter for screening for other disorder: Secondary | ICD-10-CM | POA: Diagnosis not present

## 2017-08-08 DIAGNOSIS — Z1159 Encounter for screening for other viral diseases: Secondary | ICD-10-CM | POA: Diagnosis not present

## 2017-08-08 DIAGNOSIS — B354 Tinea corporis: Secondary | ICD-10-CM | POA: Diagnosis not present

## 2017-08-08 DIAGNOSIS — Z23 Encounter for immunization: Secondary | ICD-10-CM | POA: Diagnosis not present

## 2017-08-08 DIAGNOSIS — M109 Gout, unspecified: Secondary | ICD-10-CM | POA: Diagnosis not present

## 2017-08-08 DIAGNOSIS — I4891 Unspecified atrial fibrillation: Secondary | ICD-10-CM | POA: Diagnosis not present

## 2017-08-08 DIAGNOSIS — I5030 Unspecified diastolic (congestive) heart failure: Secondary | ICD-10-CM | POA: Diagnosis not present

## 2017-08-08 DIAGNOSIS — N400 Enlarged prostate without lower urinary tract symptoms: Secondary | ICD-10-CM | POA: Diagnosis not present

## 2017-08-08 DIAGNOSIS — R911 Solitary pulmonary nodule: Secondary | ICD-10-CM | POA: Diagnosis not present

## 2017-08-08 DIAGNOSIS — E7800 Pure hypercholesterolemia, unspecified: Secondary | ICD-10-CM | POA: Diagnosis not present

## 2017-09-18 ENCOUNTER — Other Ambulatory Visit: Payer: Self-pay | Admitting: Interventional Cardiology

## 2017-09-18 ENCOUNTER — Ambulatory Visit (INDEPENDENT_AMBULATORY_CARE_PROVIDER_SITE_OTHER): Payer: PPO | Admitting: *Deleted

## 2017-09-18 DIAGNOSIS — Z5181 Encounter for therapeutic drug level monitoring: Secondary | ICD-10-CM | POA: Diagnosis not present

## 2017-09-18 DIAGNOSIS — I4891 Unspecified atrial fibrillation: Secondary | ICD-10-CM | POA: Diagnosis not present

## 2017-09-18 DIAGNOSIS — I480 Paroxysmal atrial fibrillation: Secondary | ICD-10-CM

## 2017-09-18 LAB — POCT INR: INR: 3.5

## 2017-09-18 NOTE — Patient Instructions (Addendum)
Skip today's dose, then Continue taking 1 tablet daily except 1/2 tablet on Tuesdays and Saturdays.  Recheck INR in 3 weeks. Call if placed on any new medications [phone]

## 2017-10-13 ENCOUNTER — Ambulatory Visit (INDEPENDENT_AMBULATORY_CARE_PROVIDER_SITE_OTHER): Payer: PPO | Admitting: *Deleted

## 2017-10-13 DIAGNOSIS — I480 Paroxysmal atrial fibrillation: Secondary | ICD-10-CM | POA: Diagnosis not present

## 2017-10-13 DIAGNOSIS — Z5181 Encounter for therapeutic drug level monitoring: Secondary | ICD-10-CM | POA: Diagnosis not present

## 2017-10-13 DIAGNOSIS — I4891 Unspecified atrial fibrillation: Secondary | ICD-10-CM | POA: Diagnosis not present

## 2017-10-13 LAB — POCT INR: INR: 2.6

## 2017-10-13 NOTE — Patient Instructions (Signed)
Description   Continue taking 1 tablet daily except 1/2 tablet on Tuesdays and Saturdays.  Recheck INR in 6 weeks. Call if placed on any new medications [phone]

## 2017-11-22 ENCOUNTER — Other Ambulatory Visit: Payer: Self-pay | Admitting: Interventional Cardiology

## 2017-11-24 ENCOUNTER — Ambulatory Visit (INDEPENDENT_AMBULATORY_CARE_PROVIDER_SITE_OTHER): Payer: PPO

## 2017-11-24 DIAGNOSIS — Z5181 Encounter for therapeutic drug level monitoring: Secondary | ICD-10-CM

## 2017-11-24 DIAGNOSIS — I480 Paroxysmal atrial fibrillation: Secondary | ICD-10-CM | POA: Diagnosis not present

## 2017-11-24 DIAGNOSIS — I4891 Unspecified atrial fibrillation: Secondary | ICD-10-CM

## 2017-11-24 LAB — POCT INR: INR: 2.2

## 2017-11-24 NOTE — Patient Instructions (Signed)
Description   Continue on same dosage 1 tablet daily except 1/2 tablet on Tuesdays and Saturdays.  Recheck INR in 6 weeks. Call if placed on any new medications [phone]

## 2017-12-11 NOTE — Progress Notes (Signed)
Cardiology Office Note    Date:  12/12/2017   ID:  Howard Pearson, DOB 1945-12-11, MRN [id number]  PCP:  Elias Else, MD  Cardiologist: Lesleigh Noe, MD   Chief Complaint  Patient presents with  . Atrial Fibrillation  . Congestive Heart Failure    History of Present Illness:  Howard Pearson is a 72 y.o. male with history of PA Fib /f lutter on coumadin, HTN, and history of mild LV dysfunction (EF40-45% in 2013--> normalized 2015) who presents to clinic for evaluation of cardiovascular problems.   Doing well.  Has not had syncope or neurological complaints.  Has bleeding from his nose once or twice per month.  It resolves quickly.  He has not had blood in the stool or urine.  He denies angina.  There is no orthopnea, or PND.  He does have trace to 1+ lower extremity edema.   Past Medical History:  Diagnosis Date  . BPH (benign prostatic hyperplasia)   . CHF (congestive heart failure) (HCC)   . Colon polyps   . Gangrenous cholecystitis    a. Adm 2013 with SIRS, UTI, transaminitis, dehydration, leukocytosis, gram neg bacteremia and gangrenous cholecystitis s/p cholecystectomy.  . Gout   . Hyperlipemia   . Hypertension   . PAF (paroxysmal atrial fibrillation) (HCC) 03/2012   a. Dx 03/2012. b. Placed on amio and underwent DCCV 07/2012. Stopped amio 02/2013.  . Paroxysmal atrial flutter (HCC) 2010   a. Dx 2010. b. Recurrence of flutter 09/2012 (typical appearing), 04/2013.    Past Surgical History:  Procedure Laterality Date  . CARDIOVERSION  07/19/2012   Procedure: CARDIOVERSION;  Surgeon: Lesleigh Noe, MD;  Location: [facility] ENDOSCOPY;  Service: Cardiovascular;  Laterality: N/A;  h/p in file drawer  . CHOLECYSTECTOMY  06/06/2012   Procedure: LAPAROSCOPIC CHOLECYSTECTOMY WITH INTRAOPERATIVE CHOLANGIOGRAM;  Surgeon: Kandis Cocking, MD;  Location: [facility] OR;  Service: General;  Laterality: N/A;  . FINGER TENDON REPAIR    . TOOTH EXTRACTION      Current Medications: Outpatient  Medications Prior to Visit  Medication Sig Dispense Refill  . allopurinol (ZYLOPRIM) 300 MG tablet Take 300 mg by mouth daily.    Marland Kitchen diltiazem (TIAZAC) 120 MG 24 hr capsule TAKE ONE CAPSULE BY MOUTH EVERY NIGHT AT BEDTIME 30 capsule 0  . diltiazem (TIAZAC) 240 MG 24 hr capsule TAKE ONE CAPSULE BY MOUTH EVERY MORNING 30 capsule 0  . furosemide (LASIX) 40 MG tablet Take 1 tablet by mouth three times per week 40 tablet 3  . Multiple Vitamin (MULTIVITAMIN WITH MINERALS) TABS Take 1 tablet by mouth daily.    . potassium chloride SA (K-DUR,KLOR-CON) 20 MEQ tablet Take 1 tablet (20 mEq total) by mouth 3 (three) times a week. (Take with Lasix) 40 tablet 3  . pravastatin (PRAVACHOL) 40 MG tablet Take 40 mg by mouth at bedtime.    . tamsulosin (FLOMAX) 0.4 MG CAPS capsule Take 0.4 mg by mouth daily.    Marland Kitchen warfarin (COUMADIN) 5 MG tablet TAKE AS DIRECTED BY ANTICOAGULATION CLINIC. NOTE: 30 TABLETS IS A 30 DAY SUPPLY. 30 tablet 3   No facility-administered medications prior to visit.      Allergies:   Penicillins   Social History   Socioeconomic History  . Marital status: Married    Spouse name: None  . Number of children: None  . Years of education: None  . Highest education level: None  Social Needs  . Financial resource strain: None  .  Food insecurity - worry: None  . Food insecurity - inability: None  . Transportation needs - medical: None  . Transportation needs - non-medical: None  Occupational History  . None  Tobacco Use  . Smoking status: Former Smoker    Packs/day: 1.00    Last attempt to quit: 06/03/1988    Years since quitting: 29.5  . Smokeless tobacco: Never Used  Substance and Sexual Activity  . Alcohol use: No    Alcohol/week: 0.0 oz  . Drug use: No  . Sexual activity: None  Other Topics Concern  . None  Social History Narrative  . None     Family History:  The patient's family history includes Cancer in his maternal grandmother; Heart attack in his father; Stroke in  his mother.   ROS:   Please see the history of present illness.    Feels occasional palpitations.  Snores according to his wife.  Some shortness of breath with activity. All other systems reviewed and are negative.   PHYSICAL EXAM:   VS:  BP 124/80   Pulse 72   Ht 5\' 11"  (1.803 m)   Wt 232 lb 12.8 oz (105.6 kg)   SpO2 97%   BMI 32.47 kg/m    GEN: Well nourished, well developed, in no acute distress  HEENT: normal  Neck: no JVD, carotid bruits, or masses Cardiac: IIRR; no murmurs, rubs, or gallops,no edema  Respiratory:  clear to auscultation bilaterally, normal work of breathing GI: soft, nontender, nondistended, + BS MS: no deformity or atrophy  Skin: warm and dry, no rash Neuro:  Alert and Oriented x 3, Strength and sensation are intact Psych: euthymic mood, full affect  Wt Readings from Last 3 Encounters:  12/12/17 232 lb 12.8 oz (105.6 kg)  12/09/16 228 lb 1.9 oz (103.5 kg)  02/26/16 231 lb (104.8 kg)      Studies/Labs Reviewed:   EKG:  EKG atrial fibrillation with controlled ventricular response at 72 bpm.  ST-T wave abnormality noted.  When compared to all prior tracings, no changes occurred.  Recent Labs: No results found for requested labs within last 8760 hours.   Lipid Panel No results found for: CHOL, TRIG, HDL, CHOLHDL, VLDL, LDLCALC, LDLDIRECT  Additional studies/ records that were reviewed today include:  Most recent echocardiogram done 2015 demonstrated normal LVEF of 55-60%.    ASSESSMENT:    1. Chronic atrial fibrillation (HCC)   2. PAF (paroxysmal atrial fibrillation) (HCC)   3. Essential hypertension   4. Long term current use of anticoagulant therapy      PLAN:  In order of problems listed above:  1. He is in chronic atrial fibrillation with controlled response.  He denies palpitations.  Overall stable.  We need to do an echocardiogram to follow-up on LV function. 2. Continuous A. Fib. 3. Blood pressures under excellent control on  current medical regimen. 4. Switch from Coumadin to Eliquis is encouraged.  He will try to do this through the [place].  Clinical follow-up in 1 year.  2D Doppler echocardiogram at that time.    Medication Adjustments/Labs and Tests Ordered: Current medicines are reviewed at length with the patient today.  Concerns regarding medicines are outlined above.  Medication changes, Labs and Tests ordered today are listed in the Patient Instructions below. Patient Instructions  Medication Instructions:  Your physician recommends that you continue on your current medications as directed. Please refer to the Current Medication list given to you today.  Labwork: None  Testing/Procedures: None  Follow-Up: Your physician wants you to follow-up in: 1 year with Dr. Katrinka BlazingSmith.  You will receive a reminder letter in the mail two months in advance. If you don't receive a letter, please call our office to schedule the follow-up appointment.   Any Other Special Instructions Will Be Listed Below (If Applicable).     If you need a refill on your cardiac medications before your next appointment, please call your pharmacy.      Signed, Lesleigh NoeHenry W Garnetta Fedrick III, MD  12/12/2017 11:35 AM    [facility] [address], [place], [place]  [address] Phone: [phone](336) [phone]; Fax: [phone]) [phone]

## 2017-12-12 ENCOUNTER — Ambulatory Visit: Payer: PPO | Admitting: Interventional Cardiology

## 2017-12-12 ENCOUNTER — Encounter: Payer: Self-pay | Admitting: Interventional Cardiology

## 2017-12-12 VITALS — BP 124/80 | HR 72 | Ht 71.0 in | Wt 232.8 lb

## 2017-12-12 DIAGNOSIS — I10 Essential (primary) hypertension: Secondary | ICD-10-CM

## 2017-12-12 DIAGNOSIS — I482 Chronic atrial fibrillation, unspecified: Secondary | ICD-10-CM

## 2017-12-12 DIAGNOSIS — Z7901 Long term (current) use of anticoagulants: Secondary | ICD-10-CM

## 2017-12-12 DIAGNOSIS — I480 Paroxysmal atrial fibrillation: Secondary | ICD-10-CM | POA: Diagnosis not present

## 2017-12-12 MED ORDER — DILTIAZEM HCL ER BEADS 120 MG PO CP24: 120.0000 mg | ORAL_CAPSULE | Freq: Every day | ORAL | 3 refills | Status: DC

## 2017-12-12 MED ORDER — DILTIAZEM HCL ER BEADS 240 MG PO CP24: 240.0000 mg | ORAL_CAPSULE | Freq: Every morning | ORAL | 3 refills | Status: DC

## 2017-12-12 NOTE — Patient Instructions (Signed)

## 2018-01-02 ENCOUNTER — Other Ambulatory Visit: Payer: Self-pay | Admitting: Interventional Cardiology

## 2018-01-02 ENCOUNTER — Telehealth: Payer: Self-pay | Admitting: Interventional Cardiology

## 2018-01-02 ENCOUNTER — Other Ambulatory Visit: Payer: Self-pay | Admitting: *Deleted

## 2018-01-02 MED ORDER — APIXABAN 5 MG PO TABS: 5.0000 mg | ORAL_TABLET | Freq: Two times a day (BID) | ORAL | Status: AC

## 2018-01-02 NOTE — Telephone Encounter (Signed)
Great!

## 2018-01-02 NOTE — Telephone Encounter (Signed)
New Message  Patient verbalized that he has [facility] [place] [facility].

## 2018-01-02 NOTE — Telephone Encounter (Signed)
[facility]  Advised I would send message to Dr. Katrinka BlazingSmith to let him know.  [facility] states [facility] and [facility] Eliquis.

## 2018-05-30 ENCOUNTER — Telehealth: Payer: Self-pay | Admitting: Interventional Cardiology

## 2018-05-30 DIAGNOSIS — I482 Chronic atrial fibrillation, unspecified: Secondary | ICD-10-CM

## 2018-05-30 NOTE — Telephone Encounter (Signed)
New Message    Patient is calling because he had an [facility]. But he has been unable to obtain a copy of the echocardiogram and the [facility] acts as if he never had one there. So he is wondering if Dr. Katrinka BlazingSmith would like to schedule him one. Please call.

## 2018-05-30 NOTE — Telephone Encounter (Signed)
Spoke with pt and he states he had an [facility] but when he contacted them to get a copy they told him they have no record of doing one.  Pt would like to have one done in our office if Dr. Katrinka BlazingSmith is agreeable.  Spoke with Dr. Katrinka BlazingSmith and he said this was fine.  Advised I would place order and have schedulers contact him schedule.

## 2018-06-01 ENCOUNTER — Ambulatory Visit (HOSPITAL_COMMUNITY): Payer: PPO | Attending: Cardiovascular Disease

## 2018-06-01 DIAGNOSIS — E785 Hyperlipidemia, unspecified: Secondary | ICD-10-CM | POA: Insufficient documentation

## 2018-06-01 DIAGNOSIS — I110 Hypertensive heart disease with heart failure: Secondary | ICD-10-CM | POA: Diagnosis not present

## 2018-06-01 DIAGNOSIS — I482 Chronic atrial fibrillation, unspecified: Secondary | ICD-10-CM

## 2018-06-01 DIAGNOSIS — Z87891 Personal history of nicotine dependence: Secondary | ICD-10-CM | POA: Diagnosis not present

## 2018-06-01 DIAGNOSIS — I080 Rheumatic disorders of both mitral and aortic valves: Secondary | ICD-10-CM | POA: Insufficient documentation

## 2018-06-01 DIAGNOSIS — I493 Ventricular premature depolarization: Secondary | ICD-10-CM | POA: Diagnosis not present

## 2018-06-01 DIAGNOSIS — I4892 Unspecified atrial flutter: Secondary | ICD-10-CM | POA: Insufficient documentation

## 2018-06-01 DIAGNOSIS — I509 Heart failure, unspecified: Secondary | ICD-10-CM | POA: Insufficient documentation

## 2018-08-13 DIAGNOSIS — I4891 Unspecified atrial fibrillation: Secondary | ICD-10-CM | POA: Diagnosis not present

## 2018-08-13 DIAGNOSIS — Z0000 Encounter for general adult medical examination without abnormal findings: Secondary | ICD-10-CM | POA: Diagnosis not present

## 2018-08-13 DIAGNOSIS — N400 Enlarged prostate without lower urinary tract symptoms: Secondary | ICD-10-CM | POA: Diagnosis not present

## 2018-08-13 DIAGNOSIS — I5030 Unspecified diastolic (congestive) heart failure: Secondary | ICD-10-CM | POA: Diagnosis not present

## 2018-08-13 DIAGNOSIS — E7800 Pure hypercholesterolemia, unspecified: Secondary | ICD-10-CM | POA: Diagnosis not present

## 2018-08-13 DIAGNOSIS — M109 Gout, unspecified: Secondary | ICD-10-CM | POA: Diagnosis not present

## 2018-08-13 DIAGNOSIS — M545 Low back pain: Secondary | ICD-10-CM | POA: Diagnosis not present

## 2018-08-13 DIAGNOSIS — R911 Solitary pulmonary nodule: Secondary | ICD-10-CM | POA: Diagnosis not present

## 2018-08-13 DIAGNOSIS — R233 Spontaneous ecchymoses: Secondary | ICD-10-CM | POA: Diagnosis not present

## 2018-08-13 DIAGNOSIS — Z125 Encounter for screening for malignant neoplasm of prostate: Secondary | ICD-10-CM | POA: Diagnosis not present

## 2018-08-13 DIAGNOSIS — B354 Tinea corporis: Secondary | ICD-10-CM | POA: Diagnosis not present

## 2018-08-13 DIAGNOSIS — Z1389 Encounter for screening for other disorder: Secondary | ICD-10-CM | POA: Diagnosis not present

## 2018-08-28 ENCOUNTER — Telehealth: Payer: Self-pay | Admitting: Interventional Cardiology

## 2018-08-28 NOTE — Telephone Encounter (Signed)
New Message:   Patient calling he is having some issues about some medication that he taken please call.

## 2018-08-28 NOTE — Telephone Encounter (Signed)
Spoke with pt and he states that he has an upcoming dental appt and was wondering about needing to hold Eliquis.  Pt states DDS told him to reach out to our office.  Pt having just one tooth extracted.  Spoke with Dr. Katrinka Blazing and he said ok to continue Eliquis while having one tooth extracted.  Pt verbalized understanding and was appreciative for call.

## 2018-12-12 NOTE — Progress Notes (Signed)
Cardiology Office Note:    Date:  12/13/2018   ID:  Howard Pearson, DOB 06/22/1946, MRN [id number]  PCP:  Elias Else, MD  Cardiologist:  Lesleigh Noe, MD   Referring MD: Elias Else, MD   Chief Complaint  Patient presents with  . Atrial Fibrillation  . Congestive Heart Failure    History of Present Illness:    Howard Pearson is a 73 y.o. male with a hx of PAF/flutter on coumadin, HTN, and history of mild LV dysfunction (EF40-45% in 2013-->normalized 2015) and hyperlipidemia.  For the last several years he has had decreased exertional tolerance when compared to 5 years ago.  Current level of exertional tolerance is stable and not getting worse.  He denies orthopnea, PND, and ankle swelling.  He has not had prolonged palpitations, syncope, or angina.  He is compliant with his current medical regimen.  No bleeding on anticoagulation therapy.  He has had nuisance nasal bleeding.  This does not occur if he uses saline to keep the nasal passages lubricated/hydrated.   Past Medical History:  Diagnosis Date  . BPH (benign prostatic hyperplasia)   . CHF (congestive heart failure) (HCC)   . Colon polyps   . Gangrenous cholecystitis    a. Adm 2013 with SIRS, UTI, transaminitis, dehydration, leukocytosis, gram neg bacteremia and gangrenous cholecystitis s/p cholecystectomy.  . Gout   . Hyperlipemia   . Hypertension   . PAF (paroxysmal atrial fibrillation) (HCC) 03/2012   a. Dx 03/2012. b. Placed on amio and underwent DCCV 07/2012. Stopped amio 02/2013.  . Paroxysmal atrial flutter (HCC) 2010   a. Dx 2010. b. Recurrence of flutter 09/2012 (typical appearing), 04/2013.    Past Surgical History:  Procedure Laterality Date  . CARDIOVERSION  07/19/2012   Procedure: CARDIOVERSION;  Surgeon: Lesleigh Noe, MD;  Location: [facility] ENDOSCOPY;  Service: Cardiovascular;  Laterality: N/A;  h/p in file drawer  . CHOLECYSTECTOMY  06/06/2012   Procedure: LAPAROSCOPIC CHOLECYSTECTOMY WITH  INTRAOPERATIVE CHOLANGIOGRAM;  Surgeon: Kandis Cocking, MD;  Location: [facility] OR;  Service: General;  Laterality: N/A;  . FINGER TENDON REPAIR    . TOOTH EXTRACTION      Current Medications: Current Meds  Medication Sig  . allopurinol (ZYLOPRIM) 300 MG tablet Take 300 mg by mouth daily.  Marland Kitchen apixaban (ELIQUIS) 5 MG TABS tablet Take 1 tablet (5 mg total) by mouth 2 (two) times daily.  Marland Kitchen diltiazem (TIAZAC) 360 MG 24 hr capsule Take 360 mg by mouth daily.  . finasteride (PROSCAR) 5 MG tablet Take 5 mg by mouth daily.  . furosemide (LASIX) 40 MG tablet TAKE ONE TABLET BY MOUTH THREE TIMES A WEEK  . Multiple Vitamin (MULTIVITAMIN WITH MINERALS) TABS Take 1 tablet by mouth daily.  . potassium chloride SA (K-DUR,KLOR-CON) 20 MEQ tablet TAKE ONE TABLET BY MOUTH THREE TIMES A WEEK  . pravastatin (PRAVACHOL) 40 MG tablet Take 40 mg by mouth at bedtime.  . tamsulosin (FLOMAX) 0.4 MG CAPS capsule Take 0.4 mg by mouth daily.     Allergies:   Penicillins   Social History   Socioeconomic History  . Marital status: Married    Spouse name: Not on file  . Number of children: Not on file  . Years of education: Not on file  . Highest education level: Not on file  Occupational History  . Not on file  Social Needs  . Financial resource strain: Not on file  . Food insecurity:    Worry:  Not on file    Inability: Not on file  . Transportation needs:    Medical: Not on file    Non-medical: Not on file  Tobacco Use  . Smoking status: Former Smoker    Packs/day: 1.00    Last attempt to quit: 06/03/1988    Years since quitting: 30.5  . Smokeless tobacco: Never Used  Substance and Sexual Activity  . Alcohol use: No    Alcohol/week: 0.0 standard drinks  . Drug use: No  . Sexual activity: Not on file  Lifestyle  . Physical activity:    Days per week: Not on file    Minutes per session: Not on file  . Stress: Not on file  Relationships  . Social connections:    Talks on phone: Not on file    Gets  together: Not on file    Attends religious service: Not on file    Active member of club or organization: Not on file    Attends meetings of clubs or organizations: Not on file    Relationship status: Not on file  Other Topics Concern  . Not on file  Social History Narrative  . Not on file     Family History: The patient's family history includes Cancer in his maternal grandmother; Heart attack in his father; Stroke in his mother.  ROS:   Please see the history of present illness.    No complaints of tachycardia.  Denies chest discomfort.  Energy is somewhat decreased compared to prior.  Does not snore.  Sleeps well.  All other systems reviewed and are negative.  EKGs/Labs/Other Studies Reviewed:    The following studies were reviewed today: 2D Doppler echocardiogram August 2019: Study Conclusions  - Left ventricle: LVEF is approximately 50% with hypokinesisi of   the septal wall, distal inferior wall and apex The cavity size   was moderately dilated. Wall thickness was increased in a pattern   of mild LVH. - Aortic valve: There was mild regurgitation. - Mitral valve: Bileaflet MVP. There was mild regurgitation. - Left atrium: The atrium was moderately dilated. - Right ventricle: The cavity size was mildly dilated. Systolic   function was moderately reduced. - Right atrium: The atrium was severely dilated.  Impressions:  - PVCs noted during study.  EKG:  EKG atrial fibrillation with moderate rate control and 92 bpm.  Nonspecific ST-T wave change.  When compared to prior tracing February 2019, heart rate today is slightly faster.  Recent Labs: No results found for requested labs within last 8760 hours.  Recent Lipid Panel No results found for: CHOL, TRIG, HDL, CHOLHDL, VLDL, LDLCALC, LDLDIRECT  Physical Exam:    VS:  BP 138/78   Pulse 92   Ht 5\' 11"  (1.803 m)   Wt 232 lb 6.4 oz (105.4 kg)   SpO2 97%   BMI 32.41 kg/m     Wt Readings from Last 3 Encounters:    12/13/18 232 lb 6.4 oz (105.4 kg)  12/12/17 232 lb 12.8 oz (105.6 kg)  12/09/16 228 lb 1.9 oz (103.5 kg)     GEN: Overweight.. No acute distress HEENT: Normal NECK: No JVD. LYMPHATICS: No lymphadenopathy CARDIAC: IIRR.  No murmur, no gallop, no edema VASCULAR: 2+ bilateral radial and carotid pulses, no bruits RESPIRATORY:  Clear to auscultation without rales, wheezing or rhonchi  ABDOMEN: Soft, non-tender, non-distended, No pulsatile mass, MUSCULOSKELETAL: No deformity  SKIN: Warm and dry NEUROLOGIC:  Alert and oriented x 3 PSYCHIATRIC:  Normal affect  ASSESSMENT:    1. Chronic atrial fibrillation   2. Essential hypertension   3. Long term current use of anticoagulant therapy   4. Chronic diastolic (congestive) heart failure (HCC)    PLAN:    In order of problems listed above:  1. Reasonable rate control with average heart rate 90 bpm.  Echo demonstrates some slight increase in LV size and EF 50% when evaluated in August 2019.  There was mild to moderate left atrial enlargement. 2. Blood pressure seems to be reasonably controlled.  Target should be 130/80 mmHg or less. 3. No bleeding on Eliquis other than nasal intermittent bleeding. 4. No evidence of volume overload or CHF.  Able to lie flat.  No edema.  EF 50%.  Continue to monitor for evidence of bleeding.  Keep blood pressure under 130/80 mmHg.  May need to consider adding angiotensin converting enzyme/receptor blocker therapy.  Clinical follow-up in 1 year.   Medication Adjustments/Labs and Tests Ordered: Current medicines are reviewed at length with the patient today.  Concerns regarding medicines are outlined above.  Orders Placed This Encounter  Procedures  . EKG 12-Lead   No orders of the defined types were placed in this encounter.   Patient Instructions  Medication Instructions:  Your physician recommends that you continue on your current medications as directed. Please refer to the Current  Medication list given to you today.  If you need a refill on your cardiac medications before your next appointment, please call your pharmacy.   Lab work: None If you have labs (blood work) drawn today and your tests are completely normal, you will receive your results only by: Marland Kitchen MyChart Message (if you have MyChart) OR . A paper copy in the mail If you have any lab test that is abnormal or we need to change your treatment, we will call you to review the results.  Testing/Procedures: None  Follow-Up: At [facility], you and your health needs are our priority.  As part of our continuing mission to provide you with exceptional heart care, we have created designated Provider Care Teams.  These Care Teams include your primary Cardiologist (physician) and Advanced Practice Providers (APPs -  Physician Assistants and Nurse Practitioners) who all work together to provide you with the care you need, when you need it. You will need a follow up appointment in 12 months.  Please call our office 2 months in advance to schedule this appointment.  You may see Lesleigh Noe, MD or one of the following Advanced Practice Providers on your designated Care Team:   Norma Fredrickson, NP Nada Boozer, NP . Georgie Chard, NP  Any Other Special Instructions Will Be Listed Below (If Applicable).       Signed, Lesleigh Noe, MD  12/13/2018 11:47 AM    [facility]

## 2018-12-13 ENCOUNTER — Encounter: Payer: Self-pay | Admitting: Interventional Cardiology

## 2018-12-13 ENCOUNTER — Ambulatory Visit: Payer: PPO | Admitting: Interventional Cardiology

## 2018-12-13 VITALS — BP 138/78 | HR 92 | Ht 71.0 in | Wt 232.4 lb

## 2018-12-13 DIAGNOSIS — I5032 Chronic diastolic (congestive) heart failure: Secondary | ICD-10-CM | POA: Diagnosis not present

## 2018-12-13 DIAGNOSIS — I482 Chronic atrial fibrillation, unspecified: Secondary | ICD-10-CM

## 2018-12-13 DIAGNOSIS — Z7901 Long term (current) use of anticoagulants: Secondary | ICD-10-CM | POA: Diagnosis not present

## 2018-12-13 DIAGNOSIS — I10 Essential (primary) hypertension: Secondary | ICD-10-CM

## 2018-12-13 NOTE — Patient Instructions (Signed)

## 2019-04-28 ENCOUNTER — Other Ambulatory Visit: Payer: Self-pay | Admitting: Interventional Cardiology

## 2019-05-24 DIAGNOSIS — I4891 Unspecified atrial fibrillation: Secondary | ICD-10-CM | POA: Diagnosis not present

## 2019-05-24 DIAGNOSIS — I5030 Unspecified diastolic (congestive) heart failure: Secondary | ICD-10-CM | POA: Diagnosis not present

## 2019-05-24 DIAGNOSIS — I10 Essential (primary) hypertension: Secondary | ICD-10-CM | POA: Diagnosis not present

## 2019-05-24 DIAGNOSIS — N400 Enlarged prostate without lower urinary tract symptoms: Secondary | ICD-10-CM | POA: Diagnosis not present

## 2019-05-24 DIAGNOSIS — E785 Hyperlipidemia, unspecified: Secondary | ICD-10-CM | POA: Diagnosis not present

## 2019-05-24 DIAGNOSIS — I5031 Acute diastolic (congestive) heart failure: Secondary | ICD-10-CM | POA: Diagnosis not present

## 2019-06-26 DIAGNOSIS — E7800 Pure hypercholesterolemia, unspecified: Secondary | ICD-10-CM | POA: Diagnosis not present

## 2019-06-26 DIAGNOSIS — I5031 Acute diastolic (congestive) heart failure: Secondary | ICD-10-CM | POA: Diagnosis not present

## 2019-06-26 DIAGNOSIS — N400 Enlarged prostate without lower urinary tract symptoms: Secondary | ICD-10-CM | POA: Diagnosis not present

## 2019-06-26 DIAGNOSIS — I5030 Unspecified diastolic (congestive) heart failure: Secondary | ICD-10-CM | POA: Diagnosis not present

## 2019-06-26 DIAGNOSIS — I4891 Unspecified atrial fibrillation: Secondary | ICD-10-CM | POA: Diagnosis not present

## 2019-06-26 DIAGNOSIS — E785 Hyperlipidemia, unspecified: Secondary | ICD-10-CM | POA: Diagnosis not present

## 2019-07-26 DIAGNOSIS — I5031 Acute diastolic (congestive) heart failure: Secondary | ICD-10-CM | POA: Diagnosis not present

## 2019-07-26 DIAGNOSIS — N400 Enlarged prostate without lower urinary tract symptoms: Secondary | ICD-10-CM | POA: Diagnosis not present

## 2019-07-26 DIAGNOSIS — E7800 Pure hypercholesterolemia, unspecified: Secondary | ICD-10-CM | POA: Diagnosis not present

## 2019-07-26 DIAGNOSIS — E785 Hyperlipidemia, unspecified: Secondary | ICD-10-CM | POA: Diagnosis not present

## 2019-07-26 DIAGNOSIS — I4891 Unspecified atrial fibrillation: Secondary | ICD-10-CM | POA: Diagnosis not present

## 2019-07-26 DIAGNOSIS — I5030 Unspecified diastolic (congestive) heart failure: Secondary | ICD-10-CM | POA: Diagnosis not present

## 2019-08-15 DIAGNOSIS — I5030 Unspecified diastolic (congestive) heart failure: Secondary | ICD-10-CM | POA: Diagnosis not present

## 2019-08-15 DIAGNOSIS — E7800 Pure hypercholesterolemia, unspecified: Secondary | ICD-10-CM | POA: Diagnosis not present

## 2019-08-15 DIAGNOSIS — I5031 Acute diastolic (congestive) heart failure: Secondary | ICD-10-CM | POA: Diagnosis not present

## 2019-08-15 DIAGNOSIS — N400 Enlarged prostate without lower urinary tract symptoms: Secondary | ICD-10-CM | POA: Diagnosis not present

## 2019-08-15 DIAGNOSIS — E785 Hyperlipidemia, unspecified: Secondary | ICD-10-CM | POA: Diagnosis not present

## 2019-08-15 DIAGNOSIS — I4891 Unspecified atrial fibrillation: Secondary | ICD-10-CM | POA: Diagnosis not present

## 2019-08-19 DIAGNOSIS — R202 Paresthesia of skin: Secondary | ICD-10-CM | POA: Diagnosis not present

## 2019-08-19 DIAGNOSIS — E7800 Pure hypercholesterolemia, unspecified: Secondary | ICD-10-CM | POA: Diagnosis not present

## 2019-08-19 DIAGNOSIS — R911 Solitary pulmonary nodule: Secondary | ICD-10-CM | POA: Diagnosis not present

## 2019-08-19 DIAGNOSIS — Z0000 Encounter for general adult medical examination without abnormal findings: Secondary | ICD-10-CM | POA: Diagnosis not present

## 2019-08-19 DIAGNOSIS — I5030 Unspecified diastolic (congestive) heart failure: Secondary | ICD-10-CM | POA: Diagnosis not present

## 2019-08-19 DIAGNOSIS — N400 Enlarged prostate without lower urinary tract symptoms: Secondary | ICD-10-CM | POA: Diagnosis not present

## 2019-08-19 DIAGNOSIS — Z1389 Encounter for screening for other disorder: Secondary | ICD-10-CM | POA: Diagnosis not present

## 2019-08-19 DIAGNOSIS — M109 Gout, unspecified: Secondary | ICD-10-CM | POA: Diagnosis not present

## 2019-08-19 DIAGNOSIS — I4891 Unspecified atrial fibrillation: Secondary | ICD-10-CM | POA: Diagnosis not present

## 2019-08-19 DIAGNOSIS — R233 Spontaneous ecchymoses: Secondary | ICD-10-CM | POA: Diagnosis not present

## 2019-08-19 DIAGNOSIS — Z125 Encounter for screening for malignant neoplasm of prostate: Secondary | ICD-10-CM | POA: Diagnosis not present

## 2019-09-11 DIAGNOSIS — E7800 Pure hypercholesterolemia, unspecified: Secondary | ICD-10-CM | POA: Diagnosis not present

## 2019-09-11 DIAGNOSIS — M109 Gout, unspecified: Secondary | ICD-10-CM | POA: Diagnosis not present

## 2019-09-11 DIAGNOSIS — Z125 Encounter for screening for malignant neoplasm of prostate: Secondary | ICD-10-CM | POA: Diagnosis not present

## 2019-09-12 DIAGNOSIS — E7800 Pure hypercholesterolemia, unspecified: Secondary | ICD-10-CM | POA: Diagnosis not present

## 2019-09-12 DIAGNOSIS — I5030 Unspecified diastolic (congestive) heart failure: Secondary | ICD-10-CM | POA: Diagnosis not present

## 2019-09-12 DIAGNOSIS — N400 Enlarged prostate without lower urinary tract symptoms: Secondary | ICD-10-CM | POA: Diagnosis not present

## 2019-09-12 DIAGNOSIS — E785 Hyperlipidemia, unspecified: Secondary | ICD-10-CM | POA: Diagnosis not present

## 2019-09-12 DIAGNOSIS — I5031 Acute diastolic (congestive) heart failure: Secondary | ICD-10-CM | POA: Diagnosis not present

## 2019-09-12 DIAGNOSIS — I4891 Unspecified atrial fibrillation: Secondary | ICD-10-CM | POA: Diagnosis not present

## 2019-11-10 ENCOUNTER — Ambulatory Visit: Payer: Medicare Other | Attending: Internal Medicine

## 2019-11-10 ENCOUNTER — Other Ambulatory Visit: Payer: Self-pay

## 2019-11-10 DIAGNOSIS — Z23 Encounter for immunization: Secondary | ICD-10-CM

## 2019-11-10 NOTE — Progress Notes (Signed)
   Covid-19 Vaccination Clinic  Name:  GERAL COKER    MRN: [id number] DOB: 07-03-1946  11/10/2019  Mr. Dority was observed post Covid-19 immunization for 30 minutes based on pre-vaccination screening without incidence. He was provided with Vaccine Information Sheet and instruction to access the V-Safe system.   Mr. Fines was instructed to call 911 with any severe reactions post vaccine: Marland Kitchen Difficulty breathing  . Swelling of your face and throat  . A fast heartbeat  . A bad rash all over your body  . Dizziness and weakness    Immunizations Administered    Name Date Dose VIS Date Route   Pfizer COVID-19 Vaccine 11/10/2019  1:43 PM 0.3 mL 10/11/2019 Intramuscular   Manufacturer: [employer], [employer]   Lot: [id number]   NDC: [address]-6

## 2019-11-15 DIAGNOSIS — I5031 Acute diastolic (congestive) heart failure: Secondary | ICD-10-CM | POA: Diagnosis not present

## 2019-11-15 DIAGNOSIS — N400 Enlarged prostate without lower urinary tract symptoms: Secondary | ICD-10-CM | POA: Diagnosis not present

## 2019-11-15 DIAGNOSIS — E7800 Pure hypercholesterolemia, unspecified: Secondary | ICD-10-CM | POA: Diagnosis not present

## 2019-11-15 DIAGNOSIS — I5030 Unspecified diastolic (congestive) heart failure: Secondary | ICD-10-CM | POA: Diagnosis not present

## 2019-11-15 DIAGNOSIS — I4891 Unspecified atrial fibrillation: Secondary | ICD-10-CM | POA: Diagnosis not present

## 2019-11-15 DIAGNOSIS — E785 Hyperlipidemia, unspecified: Secondary | ICD-10-CM | POA: Diagnosis not present

## 2019-12-01 ENCOUNTER — Ambulatory Visit: Payer: PPO | Attending: Internal Medicine

## 2019-12-01 DIAGNOSIS — Z23 Encounter for immunization: Secondary | ICD-10-CM | POA: Insufficient documentation

## 2019-12-01 NOTE — Progress Notes (Signed)
   Covid-19 Vaccination Clinic  Name:  Howard Pearson    MRN: [id number] DOB: 12-21-1945  12/01/2019  [name] was observed post Covid-19 immunization for 30 minutes based on pre-vaccination screening without incidence. He was provided with Vaccine Information Sheet and instruction to access the V-Safe system.   [name] was instructed to call 911 with any severe reactions post vaccine: Marland Kitchen Difficulty breathing  . Swelling of your face and throat  . A fast heartbeat  . A bad rash all over your body  . Dizziness and weakness    Immunizations Administered    Name Date Dose VIS Date Route   Pfizer COVID-19 Vaccine 12/01/2019 12:43 PM 0.3 mL 10/11/2019 Intramuscular   Manufacturer: [employer], [employer]   Lot: [id number]   NDC: [address]-1

## 2019-12-23 DIAGNOSIS — E785 Hyperlipidemia, unspecified: Secondary | ICD-10-CM | POA: Diagnosis not present

## 2019-12-23 DIAGNOSIS — I4891 Unspecified atrial fibrillation: Secondary | ICD-10-CM | POA: Diagnosis not present

## 2019-12-23 DIAGNOSIS — N400 Enlarged prostate without lower urinary tract symptoms: Secondary | ICD-10-CM | POA: Diagnosis not present

## 2019-12-23 DIAGNOSIS — I5031 Acute diastolic (congestive) heart failure: Secondary | ICD-10-CM | POA: Diagnosis not present

## 2019-12-23 DIAGNOSIS — I5030 Unspecified diastolic (congestive) heart failure: Secondary | ICD-10-CM | POA: Diagnosis not present

## 2019-12-23 DIAGNOSIS — E7800 Pure hypercholesterolemia, unspecified: Secondary | ICD-10-CM | POA: Diagnosis not present

## 2020-01-07 DIAGNOSIS — R69 Illness, unspecified: Secondary | ICD-10-CM | POA: Diagnosis not present

## 2020-01-07 NOTE — Progress Notes (Signed)
Cardiology Office Note:    Date:  01/08/2020   ID:  [name], DOB [birth year], MRN [id number]  PCP:  Elias Else, MD  Cardiologist:  Lesleigh Noe, MD   Referring MD: Elias Else, MD   Chief Complaint  Patient presents with  . Atrial Fibrillation    History of Present Illness:    Howard Pearson is a 74 y.o. male with a hx of PAF/flutter on coumadin, HTN, and history of mild LV dysfunction (EF40-45% in 2013-->normalized 2015) and hyperlipidemia.  He is doing well.  He denies angina.  He has not had syncope.  He has not had palpitations or sudden changes in exertional tolerance.    He is on anticoagulation and has had no bleeding.  He denies melena and bright red blood in the stool.  He had an episode of near fainting in January 2021.  He had taken a nap at his daughter's house with his legs propped up while sleeping on a sofa.  After about an hour he awakened and went to the bathroom quickly to urinate.  He began noticing that he was having difficulty maintaining balance.  After completing urination, he returned and attempted to sit but noticed that he was about to fall.  His daughter helped him to rest.  Shortly after sitting he felt much better.  There was no nausea or diaphoresis.  There has been no recurrence of this type episodes since then.   Past Medical History:  Diagnosis Date  . BPH (benign prostatic hyperplasia)   . CHF (congestive heart failure) (HCC)   . Colon polyps   . Gangrenous cholecystitis    a. Adm 2013 with SIRS, UTI, transaminitis, dehydration, leukocytosis, gram neg bacteremia and gangrenous cholecystitis s/p cholecystectomy.  . Gout   . Hyperlipemia   . Hypertension   . PAF (paroxysmal atrial fibrillation) (HCC) 03/2012   a. Dx 03/2012. b. Placed on amio and underwent DCCV 07/2012. Stopped amio 02/2013.  . Paroxysmal atrial flutter (HCC) 2010   a. Dx 2010. b. Recurrence of flutter 09/2012 (typical appearing), 04/2013.    Past Surgical  History:  Procedure Laterality Date  . CARDIOVERSION  07/19/2012   Procedure: CARDIOVERSION;  Surgeon: Lesleigh Noe, MD;  Location: [facility] ENDOSCOPY;  Service: Cardiovascular;  Laterality: N/A;  h/p in file drawer  . CHOLECYSTECTOMY  06/06/2012   Procedure: LAPAROSCOPIC CHOLECYSTECTOMY WITH INTRAOPERATIVE CHOLANGIOGRAM;  Surgeon: Kandis Cocking, MD;  Location: [facility] OR;  Service: General;  Laterality: N/A;  . FINGER TENDON REPAIR    . TOOTH EXTRACTION      Current Medications: Current Meds  Medication Sig  . allopurinol (ZYLOPRIM) 300 MG tablet Take 300 mg by mouth daily.  Marland Kitchen apixaban (ELIQUIS) 5 MG TABS tablet Take 1 tablet (5 mg total) by mouth 2 (two) times daily.  Marland Kitchen diltiazem (TIAZAC) 360 MG 24 hr capsule Take 360 mg by mouth daily.  . finasteride (PROSCAR) 5 MG tablet Take 5 mg by mouth daily.  . furosemide (LASIX) 40 MG tablet Take 1 tablet (40 mg total) by mouth 3 (three) times a week.  . gabapentin (NEURONTIN) 100 MG capsule Take 100 mg by mouth 3 (three) times daily.  . Multiple Vitamin (MULTIVITAMIN WITH MINERALS) TABS Take 1 tablet by mouth daily.  . potassium chloride SA (KLOR-CON) 20 MEQ tablet Take 1 tablet (20 mEq total) by mouth 3 (three) times a week.  . pravastatin (PRAVACHOL) 40 MG tablet Take 40 mg by mouth at bedtime.  Marland Kitchen  tamsulosin (FLOMAX) 0.4 MG CAPS capsule Take 0.4 mg by mouth daily.  . [DISCONTINUED] furosemide (LASIX) 40 MG tablet TAKE ONE TABLET BY MOUTH THREE TIMES A WEEK  . [DISCONTINUED] potassium chloride SA (K-DUR,KLOR-CON) 20 MEQ tablet TAKE ONE TABLET BY MOUTH THREE TIMES A WEEK     Allergies:   Penicillins   Social History   Socioeconomic History  . Marital status: Married    Spouse name: Not on file  . Number of children: Not on file  . Years of education: Not on file  . Highest education level: Not on file  Occupational History  . Not on file  Tobacco Use  . Smoking status: Former Smoker    Packs/day: 1.00    Quit date: 06/03/1988    Years  since quitting: 31.6  . Smokeless tobacco: Never Used  Substance and Sexual Activity  . Alcohol use: No    Alcohol/week: 0.0 standard drinks  . Drug use: No  . Sexual activity: Not on file  Other Topics Concern  . Not on file  Social History Narrative  . Not on file   Social Determinants of Health   Financial Resource Strain:   . Difficulty of Paying Living Expenses: Not on file  Food Insecurity:   . Worried About Programme researcher, broadcasting/film/video in the Last Year: Not on file  . Ran Out of Food in the Last Year: Not on file  Transportation Needs:   . Lack of Transportation (Medical): Not on file  . Lack of Transportation (Non-Medical): Not on file  Physical Activity:   . Days of Exercise per Week: Not on file  . Minutes of Exercise per Session: Not on file  Stress:   . Feeling of Stress : Not on file  Social Connections:   . Frequency of Communication with Friends and Family: Not on file  . Frequency of Social Gatherings with Friends and Family: Not on file  . Attends Religious Services: Not on file  . Active Member of Clubs or Organizations: Not on file  . Attends Banker Meetings: Not on file  . Marital Status: Not on file     Family History: The patient's family history includes Cancer in his maternal grandmother; Heart attack in his father; Stroke in his mother.  ROS:   Please see the history of present illness.    He is not believing vaccines or medications.  He will not receive the COVID-19 vaccination.  He has not been sick all year.  His life is not changed during the COVID-19 pandemic because his work is outside, socially distance, and then isolated work spaces.  All other systems reviewed and are negative.  EKGs/Labs/Other Studies Reviewed:    The following studies were reviewed today:  ECHO 2019: Study Conclusions   - Left ventricle: LVEF is approximately 50% with hypokinesisi of  the septal wall, distal inferior wall and apex The cavity size  was  moderately dilated. Wall thickness was increased in a pattern  of mild LVH.  - Aortic valve: There was mild regurgitation.  - Mitral valve: Bileaflet MVP. There was mild regurgitation.  - Left atrium: The atrium was moderately dilated.  - Right ventricle: The cavity size was mildly dilated. Systolic  function was moderately reduced.  - Right atrium: The atrium was severely dilated.   Impressions:   - PVCs noted during study.   EKG:  EKG  FROM 12/2018 REVEAS atrial fib,  CONTROLLED RATE,IRBBB and NSSTTWA.  Recent Labs: No results  found for requested labs within last 8760 hours.  Recent Lipid Panel No results found for: CHOL, TRIG, HDL, CHOLHDL, VLDL, LDLCALC, LDLDIRECT  Physical Exam:    VS:  BP 132/72   Pulse 78   Ht 5\' 11"  (1.803 m)   Wt 235 lb 3.2 oz (106.7 kg)   SpO2 98%   BMI 32.80 kg/m     Wt Readings from Last 3 Encounters:  01/08/20 235 lb 3.2 oz (106.7 kg)  12/13/18 232 lb 6.4 oz (105.4 kg)  12/12/17 232 lb 12.8 oz (105.6 kg)     GEN: Compatible with his age. No acute distress HEENT: Normal NECK: No JVD. LYMPHATICS: No lymphadenopathy CARDIAC: Irregularly irregular RR without murmur, gallop, or edema. VASCULAR:  Normal Pulses. No bruits. RESPIRATORY:  Clear to auscultation without rales, wheezing or rhonchi  ABDOMEN: Soft, non-tender, non-distended, No pulsatile mass, MUSCULOSKELETAL: No deformity  SKIN: Warm and dry NEUROLOGIC:  Alert and oriented x 3 PSYCHIATRIC:  Normal affect   ASSESSMENT:    1. Chronic atrial fibrillation ([place])   2. Near syncope   3. Essential hypertension   4. Chronic diastolic (congestive) heart failure ([place])   5. Long term current use of anticoagulant therapy   6. Educated about COVID-19 virus infection    PLAN:    In order of problems listed above:  1. The rate is well controlled.  There is no lightheadedness, palpitations, or neurological complaints.  Continue chronic anticoagulation and diltiazem. 2. An episode of  near syncope as described above occurred after napping with his lower extremities elevated and then getting up quickly to urinate.  Likely related to orthostatic changes plus / minus micturition related autonomic response, and blood pressure medication regimen.  Observe for future recurrence.  No specific work-up needed at this time. 3. Well-controlled 4. No evidence of volume overload.  Continue with furosemide therapy as needed. 5. Continue Eliquis at the current dose.  No bleeding complications to this point.  Most recent creatinine was 1.1 and hemoglobin was 15.4 in November 2020. 6. Has received the COVID-19 vaccine.  3W's is being observed.   Medication Adjustments/Labs and Tests Ordered: Current medicines are reviewed at length with the patient today.  Concerns regarding medicines are outlined above.  No orders of the defined types were placed in this encounter.  Meds ordered this encounter  Medications  . furosemide (LASIX) 40 MG tablet    Sig: Take 1 tablet (40 mg total) by mouth 3 (three) times a week.    Dispense:  36 tablet    Refill:  3  . potassium chloride SA (KLOR-CON) 20 MEQ tablet    Sig: Take 1 tablet (20 mEq total) by mouth 3 (three) times a week.    Dispense:  36 tablet    Refill:  3    Patient Instructions  Medication Instructions:  Your physician recommends that you continue on your current medications as directed. Please refer to the Current Medication list given to you today.  *If you need a refill on your cardiac medications before your next appointment, please call your pharmacy*   Lab Work: None If you have labs (blood work) drawn today and your tests are completely normal, you will receive your results only by: Marland Kitchen MyChart Message (if you have MyChart) OR . A paper copy in the mail If you have any lab test that is abnormal or we need to change your treatment, we will call you to review the results.   Testing/Procedures: None  Follow-Up: At [facility], you and your health needs are our priority.  As part of our continuing mission to provide you with exceptional heart care, we have created designated Provider Care Teams.  These Care Teams include your primary Cardiologist (physician) and Advanced Practice Providers (APPs -  Physician Assistants and Nurse Practitioners) who all work together to provide you with the care you need, when you need it.  We recommend signing up for the patient portal called "MyChart".  Sign up information is provided on this After Visit Summary.  MyChart is used to connect with patients for Virtual Visits (Telemedicine).  Patients are able to view lab/test results, encounter notes, upcoming appointments, etc.  Non-urgent messages can be sent to your provider as well.   To learn more about what you can do with MyChart, go to [email or web address].    Your next appointment:   12 month(s)  The format for your next appointment:   In Person  Provider:   You may see Lesleigh Noe, MD or one of the following Advanced Practice Providers on your designated Care Team:    Norma Fredrickson, NP  Nada Boozer, NP  Georgie Chard, NP    Other Instructions      Signed, Lesleigh Noe, MD  01/08/2020 12:43 PM    [facility]

## 2020-01-08 ENCOUNTER — Encounter: Payer: Self-pay | Admitting: Interventional Cardiology

## 2020-01-08 ENCOUNTER — Other Ambulatory Visit: Payer: Self-pay

## 2020-01-08 ENCOUNTER — Ambulatory Visit: Payer: PPO | Admitting: Interventional Cardiology

## 2020-01-08 VITALS — BP 132/72 | HR 78 | Ht 71.0 in | Wt 235.2 lb

## 2020-01-08 DIAGNOSIS — Z7901 Long term (current) use of anticoagulants: Secondary | ICD-10-CM | POA: Diagnosis not present

## 2020-01-08 DIAGNOSIS — I10 Essential (primary) hypertension: Secondary | ICD-10-CM

## 2020-01-08 DIAGNOSIS — I482 Chronic atrial fibrillation, unspecified: Secondary | ICD-10-CM | POA: Diagnosis not present

## 2020-01-08 DIAGNOSIS — Z7189 Other specified counseling: Secondary | ICD-10-CM | POA: Diagnosis not present

## 2020-01-08 DIAGNOSIS — R55 Syncope and collapse: Secondary | ICD-10-CM

## 2020-01-08 DIAGNOSIS — I5032 Chronic diastolic (congestive) heart failure: Secondary | ICD-10-CM

## 2020-01-08 MED ORDER — FUROSEMIDE 40 MG PO TABS: 40.0000 mg | ORAL_TABLET | ORAL | 3 refills | Status: DC

## 2020-01-08 MED ORDER — POTASSIUM CHLORIDE CRYS ER 20 MEQ PO TBCR: 20.0000 meq | EXTENDED_RELEASE_TABLET | ORAL | 3 refills | Status: DC

## 2020-01-08 NOTE — Patient Instructions (Signed)

## 2020-01-22 DIAGNOSIS — R351 Nocturia: Secondary | ICD-10-CM | POA: Diagnosis not present

## 2020-01-22 DIAGNOSIS — R972 Elevated prostate specific antigen [PSA]: Secondary | ICD-10-CM | POA: Diagnosis not present

## 2020-01-22 DIAGNOSIS — N401 Enlarged prostate with lower urinary tract symptoms: Secondary | ICD-10-CM | POA: Diagnosis not present

## 2020-02-05 DIAGNOSIS — I5030 Unspecified diastolic (congestive) heart failure: Secondary | ICD-10-CM | POA: Diagnosis not present

## 2020-02-05 DIAGNOSIS — I5031 Acute diastolic (congestive) heart failure: Secondary | ICD-10-CM | POA: Diagnosis not present

## 2020-02-05 DIAGNOSIS — E7800 Pure hypercholesterolemia, unspecified: Secondary | ICD-10-CM | POA: Diagnosis not present

## 2020-02-05 DIAGNOSIS — N400 Enlarged prostate without lower urinary tract symptoms: Secondary | ICD-10-CM | POA: Diagnosis not present

## 2020-02-05 DIAGNOSIS — I4891 Unspecified atrial fibrillation: Secondary | ICD-10-CM | POA: Diagnosis not present

## 2020-02-05 DIAGNOSIS — E785 Hyperlipidemia, unspecified: Secondary | ICD-10-CM | POA: Diagnosis not present

## 2020-08-15 DIAGNOSIS — R69 Illness, unspecified: Secondary | ICD-10-CM | POA: Diagnosis not present

## 2020-08-25 DIAGNOSIS — I4891 Unspecified atrial fibrillation: Secondary | ICD-10-CM | POA: Diagnosis not present

## 2020-08-25 DIAGNOSIS — Z1389 Encounter for screening for other disorder: Secondary | ICD-10-CM | POA: Diagnosis not present

## 2020-08-25 DIAGNOSIS — B356 Tinea cruris: Secondary | ICD-10-CM | POA: Diagnosis not present

## 2020-08-25 DIAGNOSIS — E7800 Pure hypercholesterolemia, unspecified: Secondary | ICD-10-CM | POA: Diagnosis not present

## 2020-08-25 DIAGNOSIS — M109 Gout, unspecified: Secondary | ICD-10-CM | POA: Diagnosis not present

## 2020-08-25 DIAGNOSIS — I5030 Unspecified diastolic (congestive) heart failure: Secondary | ICD-10-CM | POA: Diagnosis not present

## 2020-08-25 DIAGNOSIS — R202 Paresthesia of skin: Secondary | ICD-10-CM | POA: Diagnosis not present

## 2020-08-25 DIAGNOSIS — Z0000 Encounter for general adult medical examination without abnormal findings: Secondary | ICD-10-CM | POA: Diagnosis not present

## 2020-08-25 DIAGNOSIS — R233 Spontaneous ecchymoses: Secondary | ICD-10-CM | POA: Diagnosis not present

## 2020-08-25 DIAGNOSIS — N400 Enlarged prostate without lower urinary tract symptoms: Secondary | ICD-10-CM | POA: Diagnosis not present

## 2021-02-16 NOTE — Progress Notes (Signed)
Cardiology Office Note:    Date:  02/17/2021   ID:  Howard Pearson, DOB 25-Oct-1946, MRN [id number]  PCP:  Elias Else, MD  Cardiologist:  Lesleigh Noe, MD   Referring MD: Elias Else, MD   Chief Complaint  Patient presents with  . Atrial Fibrillation    History of Present Illness:    Howard Pearson is a 75 y.o. male with a hx of PAF/flutter on coumadin, HTN, and history of mild LV dysfunction (EF40-45% in 2013-->normalized 2015)and hyperlipidemia.  Is doing well.  He denies angina.  Energy level is adequate.  Denies orthopnea, PND, palpitations, and edema.  He has occasional right nostril bleeding on anticoagulation therapy.  Past Medical History:  Diagnosis Date  . BPH (benign prostatic hyperplasia)   . CHF (congestive heart failure) (HCC)   . Colon polyps   . Gangrenous cholecystitis    a. Adm 2013 with SIRS, UTI, transaminitis, dehydration, leukocytosis, gram neg bacteremia and gangrenous cholecystitis s/p cholecystectomy.  . Gout   . Hyperlipemia   . Hypertension   . PAF (paroxysmal atrial fibrillation) (HCC) 03/2012   a. Dx 03/2012. b. Placed on amio and underwent DCCV 07/2012. Stopped amio 02/2013.  . Paroxysmal atrial flutter (HCC) 2010   a. Dx 2010. b. Recurrence of flutter 09/2012 (typical appearing), 04/2013.    Past Surgical History:  Procedure Laterality Date  . CARDIOVERSION  07/19/2012   Procedure: CARDIOVERSION;  Surgeon: Lesleigh Noe, MD;  Location: [facility] ENDOSCOPY;  Service: Cardiovascular;  Laterality: N/A;  h/p in file drawer  . CHOLECYSTECTOMY  06/06/2012   Procedure: LAPAROSCOPIC CHOLECYSTECTOMY WITH INTRAOPERATIVE CHOLANGIOGRAM;  Surgeon: Kandis Cocking, MD;  Location: [facility] OR;  Service: General;  Laterality: N/A;  . FINGER TENDON REPAIR    . TOOTH EXTRACTION      Current Medications: Current Meds  Medication Sig  . allopurinol (ZYLOPRIM) 300 MG tablet Take 300 mg by mouth daily.  Marland Kitchen apixaban (ELIQUIS) 5 MG TABS tablet Take 1 tablet (5 mg  total) by mouth 2 (two) times daily.  Marland Kitchen diltiazem (TIAZAC) 360 MG 24 hr capsule Take 360 mg by mouth daily.  . furosemide (LASIX) 40 MG tablet Take 1 tablet (40 mg total) by mouth 3 (three) times a week.  . gabapentin (NEURONTIN) 100 MG capsule Take 100 mg by mouth 3 (three) times daily.  . Multiple Vitamin (MULTIVITAMIN WITH MINERALS) TABS Take 1 tablet by mouth daily.  . potassium chloride SA (KLOR-CON) 20 MEQ tablet Take 1 tablet (20 mEq total) by mouth 3 (three) times a week.  . pravastatin (PRAVACHOL) 40 MG tablet Take 40 mg by mouth at bedtime.  . tamsulosin (FLOMAX) 0.4 MG CAPS capsule Take 0.4 mg by mouth daily.     Allergies:   Penicillins   Social History   Socioeconomic History  . Marital status: Married    Spouse name: Not on file  . Number of children: Not on file  . Years of education: Not on file  . Highest education level: Not on file  Occupational History  . Not on file  Tobacco Use  . Smoking status: Former Smoker    Packs/day: 1.00    Quit date: 06/03/1988    Years since quitting: 32.7  . Smokeless tobacco: Never Used  Substance and Sexual Activity  . Alcohol use: No    Alcohol/week: 0.0 standard drinks  . Drug use: No  . Sexual activity: Not on file  Other Topics Concern  . Not on  file  Social History Narrative  . Not on file   Social Determinants of Health   Financial Resource Strain: Not on file  Food Insecurity: Not on file  Transportation Needs: Not on file  Physical Activity: Not on file  Stress: Not on file  Social Connections: Not on file     Family History: The patient's family history includes Cancer in his maternal grandmother; Heart attack in his father; Stroke in his mother.  ROS:   Please see the history of present illness.    No blood in his urine or stool.  Occasional nuisance bleeding from his right nostril.  This happened on Coumadin and now also happens on Eliquis.  He moved to a townhome.  He is not as active as he was before.   The decrease in activity is related to not having to take care of his yard and other things around his home.  All other systems reviewed and are negative.  EKGs/Labs/Other Studies Reviewed:    The following studies were reviewed today: No new imaging  EKG:  EKG normal sinus rhythm, atrial fibrillation with controlled ventricular response, PVC, and when compared to the prior tracing performed 12/13/2018, the heart rate is slower.  Recent Labs: No results found for requested labs within last 8760 hours.  Recent Lipid Panel No results found for: CHOL, TRIG, HDL, CHOLHDL, VLDL, LDLCALC, LDLDIRECT  Physical Exam:    VS:  BP 110/80   Pulse 61   Ht 5\' 11"  (1.803 m)   Wt 233 lb (105.7 kg)   SpO2 97%   BMI 32.50 kg/m     Wt Readings from Last 3 Encounters:  02/17/21 233 lb (105.7 kg)  01/08/20 235 lb 3.2 oz (106.7 kg)  12/13/18 232 lb 6.4 oz (105.4 kg)     GEN: Overweight. No acute distress HEENT: Normal NECK: No JVD. LYMPHATICS: No lymphadenopathy CARDIAC: No murmur. IIRR no gallop, or edema. VASCULAR:  Normal Pulses. No bruits. RESPIRATORY:  Clear to auscultation without rales, wheezing or rhonchi  ABDOMEN: Soft, non-tender, non-distended, No pulsatile mass, MUSCULOSKELETAL: No deformity  SKIN: Warm and dry NEUROLOGIC:  Alert and oriented x 3 PSYCHIATRIC:  Normal affect   ASSESSMENT:    1. Chronic atrial fibrillation (HCC)   2. Essential hypertension   3. Chronic diastolic (congestive) heart failure (HCC)   4. Long term current use of anticoagulant therapy   5. Near syncope    PLAN:    In order of problems listed above:  1. Controlled rate on current regimen.  Continue Teah Zach 360 mg/day.  Blood pressure relatively low.  May have to start backing down on drug intensity. 2. Excellent control.  Continue Teah Ziac, furosemide, Klor-Con. 3. No evidence of volume overload 4. Eliquis therapy without significant bleeding/hemorrhage.  Continue current dose.  Needs  hemoglobin and creatinine.  He prefers to have this done at the 12/15/18. 5. Not recurred.     Medication Adjustments/Labs and Tests Ordered: Current medicines are reviewed at length with the patient today.  Concerns regarding medicines are outlined above.  Orders Placed This Encounter  Procedures  . EKG 12-Lead   No orders of the defined types were placed in this encounter.   There are no Patient Instructions on file for this visit.   Signed, [place], MD  02/17/2021 3:38 PM    [facility]

## 2021-02-17 ENCOUNTER — Encounter: Payer: Self-pay | Admitting: Interventional Cardiology

## 2021-02-17 ENCOUNTER — Other Ambulatory Visit: Payer: Self-pay

## 2021-02-17 ENCOUNTER — Ambulatory Visit: Payer: Medicare HMO | Admitting: Interventional Cardiology

## 2021-02-17 VITALS — BP 110/80 | HR 61 | Ht 71.0 in | Wt 233.0 lb

## 2021-02-17 DIAGNOSIS — I10 Essential (primary) hypertension: Secondary | ICD-10-CM

## 2021-02-17 DIAGNOSIS — I482 Chronic atrial fibrillation, unspecified: Secondary | ICD-10-CM

## 2021-02-17 DIAGNOSIS — R55 Syncope and collapse: Secondary | ICD-10-CM | POA: Diagnosis not present

## 2021-02-17 DIAGNOSIS — I5032 Chronic diastolic (congestive) heart failure: Secondary | ICD-10-CM

## 2021-02-17 DIAGNOSIS — Z7901 Long term (current) use of anticoagulants: Secondary | ICD-10-CM

## 2021-02-17 NOTE — Patient Instructions (Signed)
Medication Instructions:  Your physician recommends that you continue on your current medications as directed. Please refer to the Current Medication list given to you today.  *If you need a refill on your cardiac medications before your next appointment, please call your pharmacy*   Lab Work: [facility] and [facility] the next time you are there.  This is due to you being on Eliquis.   If you have labs (blood work) drawn today and your tests are completely normal, you will receive your results only by: Marland Kitchen MyChart Message (if you have MyChart) OR . A paper copy in the mail If you have any lab test that is abnormal or we need to change your treatment, we will call you to review the results.   Testing/Procedures: None   Follow-Up: At [facility], you and your health needs are our priority.  As part of our continuing mission to provide you with exceptional heart care, we have created designated Provider Care Teams.  These Care Teams include your primary Cardiologist (physician) and Advanced Practice Providers (APPs -  Physician Assistants and Nurse Practitioners) who all work together to provide you with the care you need, when you need it.  We recommend signing up for the patient portal called "MyChart".  Sign up information is provided on this After Visit Summary.  MyChart is used to connect with patients for Virtual Visits (Telemedicine).  Patients are able to view lab/test results, encounter notes, upcoming appointments, etc.  Non-urgent messages can be sent to your provider as well.   To learn more about what you can do with MyChart, go to [email or web address].    Your next appointment:   1 year(s)  The format for your next appointment:   In Person  Provider:   You may see Lesleigh Noe, MD or one of the following Advanced Practice Providers on your designated Care Team:    Georgie Chard, NP    Other Instructions

## 2021-03-06 DIAGNOSIS — Z0100 Encounter for examination of eyes and vision without abnormal findings: Secondary | ICD-10-CM | POA: Diagnosis not present

## 2021-06-18 ENCOUNTER — Telehealth: Payer: Self-pay | Admitting: Interventional Cardiology

## 2021-06-18 NOTE — Telephone Encounter (Signed)
Spoke with pt and made him aware of information from Pharmacist.  Pt appreciative for call.  

## 2021-06-18 NOTE — Telephone Encounter (Signed)
While patient is taking Paxlovid, he should take 1/2 tablet of his Eliquis (2.5mg ) twice a day until Paxlovid is completed

## 2021-06-18 NOTE — Telephone Encounter (Signed)
Pt c/o medication issue:  1. Name of Medication: nirmatrelvir and ritonavir  2. How are you currently taking this medication (dosage and times per day)? Nirmatrelvir - two pills in the morning two at night ///  ritonavir - one  pill twice a day   3. Are you having a reaction (difficulty breathing--STAT)? no  4. What is your medication issue?   pt has covid and was advised to take paxlovid  which he is being told may conflict with Eliquis, please advise.

## 2021-07-28 ENCOUNTER — Other Ambulatory Visit: Payer: Self-pay | Admitting: Interventional Cardiology

## 2021-07-28 MED ORDER — POTASSIUM CHLORIDE CRYS ER 20 MEQ PO TBCR: 20.0000 meq | EXTENDED_RELEASE_TABLET | ORAL | 1 refills | Status: AC

## 2021-09-01 DIAGNOSIS — Z1389 Encounter for screening for other disorder: Secondary | ICD-10-CM | POA: Diagnosis not present

## 2021-09-01 DIAGNOSIS — R233 Spontaneous ecchymoses: Secondary | ICD-10-CM | POA: Diagnosis not present

## 2021-09-01 DIAGNOSIS — M109 Gout, unspecified: Secondary | ICD-10-CM | POA: Diagnosis not present

## 2021-09-01 DIAGNOSIS — B356 Tinea cruris: Secondary | ICD-10-CM | POA: Diagnosis not present

## 2021-09-01 DIAGNOSIS — I5030 Unspecified diastolic (congestive) heart failure: Secondary | ICD-10-CM | POA: Diagnosis not present

## 2021-09-01 DIAGNOSIS — Z0000 Encounter for general adult medical examination without abnormal findings: Secondary | ICD-10-CM | POA: Diagnosis not present

## 2021-09-01 DIAGNOSIS — R202 Paresthesia of skin: Secondary | ICD-10-CM | POA: Diagnosis not present

## 2021-09-01 DIAGNOSIS — I4891 Unspecified atrial fibrillation: Secondary | ICD-10-CM | POA: Diagnosis not present

## 2021-09-01 DIAGNOSIS — E7800 Pure hypercholesterolemia, unspecified: Secondary | ICD-10-CM | POA: Diagnosis not present

## 2021-09-01 DIAGNOSIS — N400 Enlarged prostate without lower urinary tract symptoms: Secondary | ICD-10-CM | POA: Diagnosis not present

## 2022-02-18 ENCOUNTER — Telehealth: Payer: Self-pay | Admitting: Interventional Cardiology

## 2022-02-18 NOTE — Telephone Encounter (Signed)
Spoke with pt and he states he had a pretty severe nose bleed last night.  Went to the bathroom during the night and scratched his nose and blood came rushing out.  Was able to get it stopped fairly quickly but it was the most bleeding he's ever had with a nose bleed.  He periodically has nose bleeds since he's been on blood thinners but this was the worst he's had.  Advised pt to continue to monitor and let us know if he has reoccurrences.  Pt has appt with Dr. Katrinka Blazing on Tuesday.  Advised to discuss this with him at that time so they can make a shared decision on where to go from here.  Pt verbalized understanding and was in agreement with plan. ?

## 2022-02-18 NOTE — Telephone Encounter (Signed)
? ?  Pt c/o medication issue: ? ?1. Name of Medication: apixaban (ELIQUIS) 5 MG TABS tablet ? ?2. How are you currently taking this medication (dosage and times per day)? Take 1 tablet (5 mg total) by mouth 2 (two) times daily. ? ?3. Are you having a reaction (difficulty breathing--STAT)?  ? ?4. What is your medication issue? Pt said, last night he hd a nose bleed, he said it was pretty severe. It did eventually stopped but his a little concern and checking in with Dr. Katrinka Blazing if he needs to change the dosage of his eliquis  ?

## 2022-02-21 NOTE — Progress Notes (Signed)
?Cardiology Office Note:   ? ?Date:  02/22/2022  ? ?ID:  Howard BillJames R Ruehl, DOB Feb 07, 1946, MRN [id number] ? ?PCP:  Elias Elseeade, Robert, MD  ?Cardiologist:  Lesleigh NoeHenry W Dorise Gangi III, MD  ? ?Referring MD: Elias Elseeade, Robert, MD  ? ?No chief complaint on file. ? ? ?History of Present Illness:   ? ?Howard Pearson is a 76 y.o. male with a hx of PAF/flutter on coumadin --> Eliquis, HTN, and history of mild LV dysfunction (EF40-45% in 2013--> normalized 2015), and hyperlipidemia. ? ?He was switched from Coumadin to Eliquis and since then has had more bleeding from his right nostril than before.  This is aggravating and he asks my opinion about neck steps.  We discussed switching back to Coumadin which she does not want to do.  We discussed seeing ENT to see if there is a structural abnormality that could be cauterized and perhaps decrease the risk of bleeding.  We did not offer discontinuation of anticoagulation because of his stroke risk being greater than or equal to 5. ? ?CHA2DS2-VASc Score = 5  ?The patient's score is based upon: ?CHF History: 1 ?HTN History: 1 ?Diabetes History: 0 ?Stroke History: 0 ?Vascular Disease History: 1 ?Age Score: 2 ?Gender Score: 0 ? ?Past Medical History:  ?Diagnosis Date  ? BPH (benign prostatic hyperplasia)   ? CHF (congestive heart failure) (HCC)   ? Colon polyps   ? Gangrenous cholecystitis   ? a. Adm 2013 with SIRS, UTI, transaminitis, dehydration, leukocytosis, gram neg bacteremia and gangrenous cholecystitis s/p cholecystectomy.  ? Gout   ? Hyperlipemia   ? Hypertension   ? PAF (paroxysmal atrial fibrillation) (HCC) 03/2012  ? a. Dx 03/2012. b. Placed on amio and underwent DCCV 07/2012. Stopped amio 02/2013.  ? Paroxysmal atrial flutter (HCC) 2010  ? a. Dx 2010. b. Recurrence of flutter 09/2012 (typical appearing), 04/2013.  ? ? ?Past Surgical History:  ?Procedure Laterality Date  ? CARDIOVERSION  07/19/2012  ? Procedure: CARDIOVERSION;  Surgeon: Lesleigh NoeHenry W Avonelle Viveros III, MD;  [facility] END[facility];  Service:  Cardiovascular;  Laterality: N/A;  h/p in file drawer  ? CHOLECYSTECTOMY  06/06/2012  ? Procedure: LAPAROSCOPIC CHOLECYSTECTOMY WITH INTRAOPERATIVE CHOLANGIOGRAM;  Surgeon: Kandis Cockingavid H Newman, MD;  Location: [facility] OR;  Service: General;  Laterality: N/A;  ? FINGER TENDON REPAIR    ? TOOTH EXTRACTION    ? ? ?Current Medications: ?Current Meds  ?Medication Sig  ? acetaminophen (TYLENOL) 500 MG tablet Take 1 tablet by mouth as needed.  ? allopurinol (ZYLOPRIM) 300 MG tablet Take 300 mg by mouth daily.  ? apixaban (ELIQUIS) 5 MG TABS tablet Take 1 tablet (5 mg total) by mouth 2 (two) times daily.  ? carboxymethylcellulose (REFRESH PLUS) 0.5 % SOLN Place 1 drop into both eyes in the morning, at noon, in the evening, and at bedtime.  ? diltiazem (TIAZAC) 360 MG 24 hr capsule Take 360 mg by mouth daily.  ? furosemide (LASIX) 40 MG tablet TAKE ONE TABLET BY MOUTH THREE TIMES PER WEEK  ? gabapentin (NEURONTIN) 100 MG capsule Take 100 mg by mouth 3 (three) times daily.  ? ketoconazole (NIZORAL) 2 % cream 1 application. as needed.  ? Multiple Vitamin (MULTIVITAMIN WITH MINERALS) TABS Take 1 tablet by mouth daily.  ? potassium chloride SA (KLOR-CON) 20 MEQ tablet Take 1 tablet (20 mEq total) by mouth 3 (three) times a week.  ? pravastatin (PRAVACHOL) 40 MG tablet Take 40 mg by mouth at bedtime.  ? tadalafil (CIALIS) 5 MG  tablet Take 1 tablet by mouth 2 (two) times daily.  ? tamsulosin (FLOMAX) 0.4 MG CAPS capsule Take 0.4 mg by mouth daily.  ?  ? ?Allergies:   Penicillins  ? ?Social History  ? ?Socioeconomic History  ? Marital status: Married  ?  Spouse name: Not on file  ? Number of children: Not on file  ? Years of education: Not on file  ? Highest education level: Not on file  ?Occupational History  ? Not on file  ?Tobacco Use  ? Smoking status: Former  ?  Packs/day: 1.00  ?  Types: Cigarettes  ?  Quit date: 06/03/1988  ?  Years since quitting: 33.7  ? Smokeless tobacco: Never  ?Substance and Sexual Activity  ? Alcohol use: No  ?   Alcohol/week: 0.0 standard drinks  ? Drug use: No  ? Sexual activity: Not on file  ?Other Topics Concern  ? Not on file  ?Social History Narrative  ? Not on file  ? ?Social Determinants of Health  ? ?Financial Resource Strain: Not on file  ?Food Insecurity: Not on file  ?Transportation Needs: Not on file  ?Physical Activity: Not on file  ?Stress: Not on file  ?Social Connections: Not on file  ?  ? ?Family History: ?The patient's family history includes Cancer in his maternal grandmother; Heart attack in his father; Stroke in his mother. ? ?ROS:   ?Please see the history of present illness.    ?Recurrent nosebleeds.  Occasional left shoulder pain that is transient.  No chest pain of significance.  All other systems reviewed and are negative. ? ?EKGs/Labs/Other Studies Reviewed:   ? ?The following studies were reviewed today: ?No new imaging ? ?EKG:  EKG atrial fibrillation, controlled rate, interventricular conduction delay, leftward axis. ? ?Recent Labs: ?No results found for requested labs within last 8760 hours.  ?Recent Lipid Panel ?No results found for: CHOL, TRIG, HDL, CHOLHDL, VLDL, LDLCALC, LDLDIRECT ? ?Physical Exam:   ? ?VS:  BP 108/62   Pulse 71   Ht 5\' 11"  (1.803 m)   Wt 227 lb 12.8 oz (103.3 kg)   SpO2 96%   BMI 31.77 kg/m?    ? ?Wt Readings from Last 3 Encounters:  ?02/22/22 227 lb 12.8 oz (103.3 kg)  ?02/17/21 233 lb (105.7 kg)  ?01/08/20 235 lb 3.2 oz (106.7 kg)  ?  ? ?GEN: Overweight. No acute distress ?HEENT: Normal ?NECK: No JVD. ?LYMPHATICS: No lymphadenopathy ?CARDIAC: No murmur. IIRR no gallop, or edema. ?VASCULAR:  Normal Pulses. No bruits. ?RESPIRATORY:  Clear to auscultation without rales, wheezing or rhonchi  ?ABDOMEN: Soft, non-tender, non-distended, No pulsatile mass, ?MUSCULOSKELETAL: No deformity  ?SKIN: Warm and dry ?NEUROLOGIC:  Alert and oriented x 3 ?PSYCHIATRIC:  Normal affect  ? ?ASSESSMENT:   ? ?1. Chronic atrial fibrillation (HCC)   ?2. Essential hypertension   ?3. Chronic  diastolic (congestive) heart failure (HCC)   ?4. Long term current use of anticoagulant therapy   ?5. Right-sided epistaxis   ? ?PLAN:   ? ?In order of problems listed above: ? ?Controlled rate and appropriately anticoagulated.  No evidence of LV dysfunction on exam and no symptoms felt to be secondary to atrial fibs. ?Blood pressure control is quite good on current therapy that includes diltiazem and furosemide. ?No volume overload or symptoms of heart failure.  Will reserve SGLT2 therapy for some point in the future should he develop dyspnea. ?On Eliquis, he has had more frequent right nostril epistaxis.  He needs an  ENT consult to determine if there is an anatomical abnormality that can be corrected and lead to decreased episodes of bleeding. ?See #4 above. ? ?Clinical follow-up in 1 year.  Earlier if cardiac symptoms. ? ? ?Medication Adjustments/Labs and Tests Ordered: ?Current medicines are reviewed at length with the patient today.  Concerns regarding medicines are outlined above.  ?Orders Placed This Encounter  ?Procedures  ? EKG 12-Lead  ? ?No orders of the defined types were placed in this encounter. ? ? ?Patient Instructions  ?Medication Instructions:  ?Your physician recommends that you continue on your current medications as directed. Please refer to the Current Medication list given to you today. ? ?*If you need a refill on your cardiac medications before your next appointment, please call your pharmacy* ? ? ?Lab Work: ?NONE ?If you have labs (blood work) drawn today and your tests are completely normal, you will receive your results only by: ?MyChart Message (if you have MyChart) OR ?A paper copy in the mail ?If you have any lab test that is abnormal or we need to change your treatment, we will call you to review the results. ? ? ?Testing/Procedures: ?NONE ? ? ?Follow-Up: ?At [facility], you and your health needs are our priority.  As part of our continuing mission to provide you with exceptional  heart care, we have created designated Provider Care Teams.  These Care Teams include your primary Cardiologist (physician) and Advanced Practice Providers (APPs -  Physician Assistants and Nurse Practitioners) who a

## 2022-02-22 ENCOUNTER — Encounter: Payer: Self-pay | Admitting: Interventional Cardiology

## 2022-02-22 ENCOUNTER — Ambulatory Visit: Payer: Medicare HMO | Admitting: Interventional Cardiology

## 2022-02-22 VITALS — BP 108/62 | HR 71 | Ht 71.0 in | Wt 227.8 lb

## 2022-02-22 DIAGNOSIS — Z7901 Long term (current) use of anticoagulants: Secondary | ICD-10-CM | POA: Diagnosis not present

## 2022-02-22 DIAGNOSIS — I482 Chronic atrial fibrillation, unspecified: Secondary | ICD-10-CM

## 2022-02-22 DIAGNOSIS — I10 Essential (primary) hypertension: Secondary | ICD-10-CM | POA: Diagnosis not present

## 2022-02-22 DIAGNOSIS — I5032 Chronic diastolic (congestive) heart failure: Secondary | ICD-10-CM | POA: Diagnosis not present

## 2022-02-22 DIAGNOSIS — R040 Epistaxis: Secondary | ICD-10-CM | POA: Diagnosis not present

## 2022-02-22 NOTE — Patient Instructions (Signed)
Medication Instructions:  ?Your physician recommends that you continue on your current medications as directed. Please refer to the Current Medication list given to you today. ? ?*If you need a refill on your cardiac medications before your next appointment, please call your pharmacy* ? ? ?Lab Work: ?NONE ?If you have labs (blood work) drawn today and your tests are completely normal, you will receive your results only by: ?MyChart Message (if you have MyChart) OR ?A paper copy in the mail ?If you have any lab test that is abnormal or we need to change your treatment, we will call you to review the results. ? ? ?Testing/Procedures: ?NONE ? ? ?Follow-Up: ?At [facility], you and your health needs are our priority.  As part of our continuing mission to provide you with exceptional heart care, we have created designated Provider Care Teams.  These Care Teams include your primary Cardiologist (physician) and Advanced Practice Providers (APPs -  Physician Assistants and Nurse Practitioners) who all work together to provide you with the care you need, when you need it. ? ?We recommend signing up for the patient portal called "MyChart".  Sign up information is provided on this After Visit Summary.  MyChart is used to connect with patients for Virtual Visits (Telemedicine).  Patients are able to view lab/test results, encounter notes, upcoming appointments, etc.  Non-urgent messages can be sent to your provider as well.   ?To learn more about what you can do with MyChart, go to [email or web address].   ? ?Your next appointment:   ?12 month(s) ? ?The format for your next appointment:   ?In Person ? ?Provider:   ?Lesleigh Noe, MD   ? ? ?Important Information About Sugar ? ? ? ? ?  ?

## 2022-09-06 DIAGNOSIS — Z0000 Encounter for general adult medical examination without abnormal findings: Secondary | ICD-10-CM | POA: Diagnosis not present

## 2022-09-06 DIAGNOSIS — Z6833 Body mass index (BMI) 33.0-33.9, adult: Secondary | ICD-10-CM | POA: Diagnosis not present

## 2022-09-06 DIAGNOSIS — Z1331 Encounter for screening for depression: Secondary | ICD-10-CM | POA: Diagnosis not present

## 2022-09-06 DIAGNOSIS — I4891 Unspecified atrial fibrillation: Secondary | ICD-10-CM | POA: Diagnosis not present

## 2022-09-06 DIAGNOSIS — I5032 Chronic diastolic (congestive) heart failure: Secondary | ICD-10-CM | POA: Diagnosis not present

## 2022-09-06 DIAGNOSIS — E7800 Pure hypercholesterolemia, unspecified: Secondary | ICD-10-CM | POA: Diagnosis not present

## 2022-09-06 DIAGNOSIS — M109 Gout, unspecified: Secondary | ICD-10-CM | POA: Diagnosis not present

## 2022-09-06 DIAGNOSIS — R233 Spontaneous ecchymoses: Secondary | ICD-10-CM | POA: Diagnosis not present

## 2022-09-06 DIAGNOSIS — I10 Essential (primary) hypertension: Secondary | ICD-10-CM | POA: Diagnosis not present

## 2022-09-06 DIAGNOSIS — I5030 Unspecified diastolic (congestive) heart failure: Secondary | ICD-10-CM | POA: Diagnosis not present

## 2022-09-06 DIAGNOSIS — N400 Enlarged prostate without lower urinary tract symptoms: Secondary | ICD-10-CM | POA: Diagnosis not present

## 2022-09-06 DIAGNOSIS — R7303 Prediabetes: Secondary | ICD-10-CM | POA: Diagnosis not present

## 2023-01-31 DIAGNOSIS — Z0100 Encounter for examination of eyes and vision without abnormal findings: Secondary | ICD-10-CM | POA: Diagnosis not present

## 2023-02-24 DIAGNOSIS — R69 Illness, unspecified: Secondary | ICD-10-CM | POA: Diagnosis not present

## 2024-04-29 DIAGNOSIS — I110 Hypertensive heart disease with heart failure: Secondary | ICD-10-CM | POA: Diagnosis not present

## 2024-04-29 DIAGNOSIS — N400 Enlarged prostate without lower urinary tract symptoms: Secondary | ICD-10-CM | POA: Diagnosis not present

## 2024-04-29 DIAGNOSIS — I5032 Chronic diastolic (congestive) heart failure: Secondary | ICD-10-CM | POA: Diagnosis not present

## 2024-04-29 DIAGNOSIS — I482 Chronic atrial fibrillation, unspecified: Secondary | ICD-10-CM | POA: Diagnosis not present

## 2024-05-30 DIAGNOSIS — I5032 Chronic diastolic (congestive) heart failure: Secondary | ICD-10-CM | POA: Diagnosis not present

## 2024-05-30 DIAGNOSIS — I110 Hypertensive heart disease with heart failure: Secondary | ICD-10-CM | POA: Diagnosis not present

## 2024-05-30 DIAGNOSIS — N400 Enlarged prostate without lower urinary tract symptoms: Secondary | ICD-10-CM | POA: Diagnosis not present

## 2024-05-30 DIAGNOSIS — I482 Chronic atrial fibrillation, unspecified: Secondary | ICD-10-CM | POA: Diagnosis not present

## 2024-06-30 DIAGNOSIS — I110 Hypertensive heart disease with heart failure: Secondary | ICD-10-CM | POA: Diagnosis not present

## 2024-06-30 DIAGNOSIS — I5032 Chronic diastolic (congestive) heart failure: Secondary | ICD-10-CM | POA: Diagnosis not present

## 2024-06-30 DIAGNOSIS — I482 Chronic atrial fibrillation, unspecified: Secondary | ICD-10-CM | POA: Diagnosis not present

## 2024-06-30 DIAGNOSIS — N400 Enlarged prostate without lower urinary tract symptoms: Secondary | ICD-10-CM | POA: Diagnosis not present
